# Patient Record
Sex: Female | Born: 1980 | Race: White | Hispanic: Yes | Marital: Married | Smoking: Former smoker
Health system: Southern US, Community
[De-identification: ages and names within clinical notes are randomized; demographics above are authoritative.]

## PROBLEM LIST (undated history)

## (undated) ENCOUNTER — Inpatient Hospital Stay (HOSPITAL_COMMUNITY): Payer: Self-pay

## (undated) DIAGNOSIS — K8020 Calculus of gallbladder without cholecystitis without obstruction: Secondary | ICD-10-CM

## (undated) DIAGNOSIS — F410 Panic disorder [episodic paroxysmal anxiety] without agoraphobia: Secondary | ICD-10-CM

## (undated) DIAGNOSIS — R7989 Other specified abnormal findings of blood chemistry: Secondary | ICD-10-CM

## (undated) DIAGNOSIS — K5090 Crohn's disease, unspecified, without complications: Secondary | ICD-10-CM

## (undated) HISTORY — DX: Panic disorder (episodic paroxysmal anxiety): F41.0

## (undated) HISTORY — DX: Crohn's disease, unspecified, without complications: K50.90

## (undated) HISTORY — DX: Other specified abnormal findings of blood chemistry: R79.89

## (undated) HISTORY — PX: COSMETIC SURGERY: SHX468

---

## 2015-09-11 ENCOUNTER — Encounter (HOSPITAL_BASED_OUTPATIENT_CLINIC_OR_DEPARTMENT_OTHER): Payer: Self-pay | Admitting: Emergency Medicine

## 2015-09-11 ENCOUNTER — Emergency Department (HOSPITAL_BASED_OUTPATIENT_CLINIC_OR_DEPARTMENT_OTHER): Payer: BLUE CROSS/BLUE SHIELD

## 2015-09-11 ENCOUNTER — Emergency Department (HOSPITAL_BASED_OUTPATIENT_CLINIC_OR_DEPARTMENT_OTHER)
Admission: EM | Admit: 2015-09-11 | Discharge: 2015-09-11 | Disposition: A | Discharge status: Home / Self Care | Payer: BLUE CROSS/BLUE SHIELD | Attending: Emergency Medicine | Admitting: Emergency Medicine

## 2015-09-11 DIAGNOSIS — Z793 Long term (current) use of hormonal contraceptives: Secondary | ICD-10-CM | POA: Diagnosis not present

## 2015-09-11 DIAGNOSIS — S40011A Contusion of right shoulder, initial encounter: Secondary | ICD-10-CM | POA: Diagnosis not present

## 2015-09-11 DIAGNOSIS — Z880 Allergy status to penicillin: Secondary | ICD-10-CM | POA: Diagnosis not present

## 2015-09-11 DIAGNOSIS — Y9289 Other specified places as the place of occurrence of the external cause: Secondary | ICD-10-CM | POA: Diagnosis not present

## 2015-09-11 DIAGNOSIS — Y998 Other external cause status: Secondary | ICD-10-CM | POA: Diagnosis not present

## 2015-09-11 DIAGNOSIS — Y9301 Activity, walking, marching and hiking: Secondary | ICD-10-CM | POA: Insufficient documentation

## 2015-09-11 DIAGNOSIS — F17200 Nicotine dependence, unspecified, uncomplicated: Secondary | ICD-10-CM | POA: Diagnosis not present

## 2015-09-11 DIAGNOSIS — W01198A Fall on same level from slipping, tripping and stumbling with subsequent striking against other object, initial encounter: Secondary | ICD-10-CM | POA: Diagnosis not present

## 2015-09-11 DIAGNOSIS — S4991XA Unspecified injury of right shoulder and upper arm, initial encounter: Secondary | ICD-10-CM | POA: Diagnosis present

## 2015-09-11 NOTE — ED Provider Notes (Signed)
CSN: [id number]     Arrival date & time 09/11/15  3382 History   First MD Initiated Contact with Patient 09/11/15 [phone]     Chief Complaint  Patient presents with  . Shoulder Injury    right      The history is provided by the patient. No language interpreter was used.   Marisa Sharp is a 34 y.o. female who presents to the Emergency Department complaining of right shoulder injury. Yesterday she was walking in her garage and tripped over the concrete slab, sliding forward. She had both arms outstretched. She had mild pain in her right shoulder when the fall occurred.  She applied ice and went to bed. This morning when she woke up she had too much pain to move the right shoulder. She is right-handed. No prior similar injuries. Symptoms are moderate, constant, worsening.  History reviewed. No pertinent past medical history. History reviewed. No pertinent past surgical history. No family history on file. Social History  Substance Use Topics  . Smoking status: Current Every Day Smoker  . Smokeless tobacco: None  . Alcohol Use: 0.6 oz/week    1 Glasses of wine per week   OB History    No data available     Review of Systems  All other systems reviewed and are negative.     Allergies  Penicillins  Home Medications   Prior to Admission medications   Medication Sig Start Date End Date Taking? Authorizing Provider  norethindrone-ethinyl estradiol (CYCLAFEM,ALYACEN) 0.5/0.75/1-35 MG-MCG tablet Take 1 tablet by mouth daily.   Yes Historical Provider, MD   BP 130/87 mmHg  Pulse 99  Temp(Src) 97.7 F (36.5 C) (Oral)  Resp 18  Ht 5' 4"  (1.626 m)  Wt 149 lb 14.6 oz (68 kg)  BMI 25.72 kg/m2  SpO2 100%  LMP 07/31/2015 (Approximate) Physical Exam  Constitutional: She is oriented to person, place, and time. She appears well-developed and well-nourished.  HENT:  Head: Normocephalic and atraumatic.  Cardiovascular: Normal rate and regular rhythm.   No murmur  heard. Pulmonary/Chest: Effort normal and breath sounds normal. No respiratory distress. She exhibits no tenderness.  Musculoskeletal:  2+ radial pulses. No clavicle tenderness to palpation. There is tenderness over the right upper arm and anterior and lateral shoulder. Patient is able to cross midline with the right arm. Full range of motion is intact and he elbow, wrist, digits. Unable to fully abduct right shoulder.  Neurological: She is alert and oriented to person, place, and time.  Skin: Skin is warm and dry.  Psychiatric: She has a normal mood and affect. Her behavior is normal.  Nursing note and vitals reviewed.   ED Course  Procedures (including critical care time) Labs Review Labs Reviewed - No data to display  Imaging Review No results found. I have personally reviewed and evaluated these images and lab results as part of my medical decision-making.   EKG Interpretation None      MDM   Final diagnoses:  Shoulder contusion, right, initial encounter    Patient here for evaluation of shoulder pain following an injury yesterday. She has local tenderness on examination with good perfusion. No evidence of acute fracture or dislocation. Discussed with patient home care for shoulder contusion with range of motion exercises. Outpatient follow-up discussed.    Quintella Reichert, MD 09/11/15 [phone]

## 2015-09-11 NOTE — ED Notes (Signed)
Patient states she was in the garage and fell.  She states she hit the concrete with her right shoulder.

## 2015-09-11 NOTE — ED Notes (Signed)
Discharge instructions given to patient, verbal understanding related.

## 2015-09-11 NOTE — Discharge Instructions (Signed)
Shoulder Sprain A shoulder sprain is a partial or complete tear in one of the tough, fiber-like tissues (ligaments) in the shoulder. The ligaments in the shoulder help to hold the shoulder in place. CAUSES This condition may be caused by:  A fall.  A hit to the shoulder.  A twist of the arm. RISK FACTORS This condition is more likely to develop in:  People who play sports.  People who have problems with balance or coordination. SYMPTOMS Symptoms of this condition include:  Pain when moving the shoulder.  Limited ability to move the shoulder.  Swelling and tenderness on top of the shoulder.  Warmth in the shoulder.  A change in the shape of the shoulder.  Redness or bruising on the shoulder. DIAGNOSIS This condition is diagnosed with a physical exam. During the exam, you may be asked to do simple exercises with your shoulder. You may also have imaging tests, such as X-rays, MRI, or a CT scan. These tests can show how severe the sprain is. TREATMENT This condition may be treated with:  Rest.  Pain medicine.  Ice.  A sling or brace. This is used to keep the arm still while the shoulder is healing.  Physical therapy or rehabilitation exercises. These help to improve the range of motion and strength of the shoulder.  Surgery (rare). Surgery may be needed if the sprain caused a joint to become unstable. Surgery may also be needed to reduce pain. Some people may develop ongoing shoulder pain or lose some range of motion in the shoulder. However, most people do not develop long-term problems. HOME CARE INSTRUCTIONS  Rest.  Take over-the-counter and prescription medicines only as told by your health care provider.  If directed, apply ice to the area:  Put ice in a plastic bag.  Place a towel between your skin and the bag.  Leave the ice on for 20 minutes, 2-3 times per day.  If you were given a shoulder sling or brace:  Wear it as told.  Remove it to shower or  bathe.  Move your arm only as much as told by your health care provider, but keep your hand moving to prevent swelling.  If you were shown how to do any exercises, do them as told by your health care provider.  Keep all follow-up visits as told by your health care provider. This is important. SEEK MEDICAL CARE IF:  Your pain gets worse.  Your pain is not relieved with medicines.  You have increased redness or swelling. SEEK IMMEDIATE MEDICAL CARE IF:  You have a fever.  You cannot move your arm or shoulder.  You develop numbness or tingling in your arms, hands, or fingers.   This information is not intended to replace advice given to you by your health care provider. Make sure you discuss any questions you have with your health care provider.   Document Released: 02/05/2009 Document Revised: 06/10/2015 Document Reviewed: 01/12/2015 Elsevier Interactive Patient Education [employer].

## 2015-09-15 ENCOUNTER — Ambulatory Visit: Payer: BLUE CROSS/BLUE SHIELD | Admitting: Family Medicine

## 2015-09-16 ENCOUNTER — Ambulatory Visit (INDEPENDENT_AMBULATORY_CARE_PROVIDER_SITE_OTHER): Payer: BLUE CROSS/BLUE SHIELD | Admitting: Family Medicine

## 2015-09-16 DIAGNOSIS — S4991XA Unspecified injury of right shoulder and upper arm, initial encounter: Secondary | ICD-10-CM

## 2015-09-16 MED ORDER — HYDROCODONE-ACETAMINOPHEN 5-325 MG PO TABS: 1.0000 | ORAL_TABLET | Freq: Four times a day (QID) | ORAL | Status: DC | PRN

## 2015-09-16 MED ORDER — DICLOFENAC SODIUM 75 MG PO TBEC: 75.0000 mg | DELAYED_RELEASE_TABLET | Freq: Two times a day (BID) | ORAL | Status: DC

## 2015-09-16 NOTE — Patient Instructions (Addendum)
You have strained your rotator cuff. Try to avoid painful activities (overhead activities, lifting with extended arm) as much as possible. Voltaren twice a day with food for pain and inflammation. Norco as needed for severe pain (no driving on this medicine). Consider physical therapy with transition to home exercise program. Do home exercises (arm circles, arm swings, table slides) 3 sets of 10 twice a day. When you're able to do home exercise program with theraband and scapular stabilization exercises daily - these are very important for long term relief.  I suspect it will be difficult for you to do these the next 2 weeks though. If not improving at follow-up we will consider MRI, physical therapy. Follow up with me in 2 weeks for reevaluation.

## 2015-09-18 DIAGNOSIS — S4991XA Unspecified injury of right shoulder and upper arm, initial encounter: Secondary | ICD-10-CM | POA: Insufficient documentation

## 2015-09-18 NOTE — Progress Notes (Signed)
PCP: No PCP Per Patient  Subjective:   HPI: Patient is a 34 y.o. female here for right shoulder injury.  Patient reports on 12/8 she tripped over a concrete block in the garage and fell forward (to right side as well) with arms extended in front of her. Couldn't move right shoulder mainly after this. Pain level 5/10 with movement. Worse with reaching and overhead motions. Has been icing. Pain is sharp. No skin changes, fever, other complaints. No prior history of shoulder issues.  No past medical history on file.  Current Outpatient Prescriptions on File Prior to Visit  Medication Sig Dispense Refill  . norethindrone-ethinyl estradiol (CYCLAFEM,ALYACEN) 0.5/0.75/1-35 MG-MCG tablet Take 1 tablet by mouth daily.     No current facility-administered medications on file prior to visit.    No past surgical history on file.  Allergies  Allergen Reactions  . Penicillins     Social History   Social History  . Marital Status: Married    Spouse Name: N/A  . Number of Children: N/A  . Years of Education: N/A   Occupational History  . Not on file.   Social History Main Topics  . Smoking status: Current Every Day Smoker  . Smokeless tobacco: Not on file  . Alcohol Use: 0.6 oz/week    1 Glasses of wine per week  . Drug Use: No  . Sexual Activity: Not on file   Other Topics Concern  . Not on file   Social History Narrative  . No narrative on file    No family history on file.  LMP 08/28/2015  Review of Systems: See HPI above.    Objective:  Physical Exam:  Gen: NAD  Right shoulder: No swelling, ecchymoses.  No gross deformity. No TTP. ROM - full ER and IR.  Abduction to 100 degrees, painful.  Flexion 90 degrees. Positive Hawkins, Neers. Negative Speeds, Yergasons. Strength 5/5 with empty can and resisted internal/external rotation.  Pain empty can. Negative apprehension. NV intact distally.  Left shoulder: FROM without pain.  MSK u/s Right shoulder  - Biceps tendon intact on long and trans views.  AC joint normal without arthropathy or geyser sign.  Subscapularis normal without impingement.  Infraspinatus and supraspinatus normal without tears.  No subacromial bursitis.  Assessment & Plan:  1. Right shoulder injury - Ultrasound reassuring.  Consistent with rotator cuff strain, specifically supraspinatus.  Start voltaren with norco as needed.  Shown home exercises to do daily.  Can start theraband strengthening when tolerated.  F/u in 2 weeks.  Hopefully can add physical therapy at that time.

## 2015-09-18 NOTE — Assessment & Plan Note (Signed)
Ultrasound reassuring.  Consistent with rotator cuff strain, specifically supraspinatus.  Start voltaren with norco as needed.  Shown home exercises to do daily.  Can start theraband strengthening when tolerated.  F/u in 2 weeks.  Hopefully can add physical therapy at that time.

## 2015-10-04 DIAGNOSIS — R7989 Other specified abnormal findings of blood chemistry: Secondary | ICD-10-CM

## 2015-10-04 HISTORY — DX: Other specified abnormal findings of blood chemistry: R79.89

## 2016-01-02 DIAGNOSIS — K5090 Crohn's disease, unspecified, without complications: Secondary | ICD-10-CM

## 2016-01-02 HISTORY — DX: Crohn's disease, unspecified, without complications: K50.90

## 2016-05-09 ENCOUNTER — Ambulatory Visit (INDEPENDENT_AMBULATORY_CARE_PROVIDER_SITE_OTHER): Payer: BLUE CROSS/BLUE SHIELD | Admitting: Obstetrics and Gynecology

## 2016-05-09 ENCOUNTER — Encounter: Payer: Self-pay | Admitting: Obstetrics and Gynecology

## 2016-05-09 VITALS — BP 110/64 | HR 88 | Resp 16 | Ht 64.25 in | Wt 163.8 lb

## 2016-05-09 DIAGNOSIS — Z01419 Encounter for gynecological examination (general) (routine) without abnormal findings: Secondary | ICD-10-CM | POA: Diagnosis not present

## 2016-05-09 DIAGNOSIS — E559 Vitamin D deficiency, unspecified: Secondary | ICD-10-CM

## 2016-05-09 DIAGNOSIS — Z0000 Encounter for general adult medical examination without abnormal findings: Secondary | ICD-10-CM

## 2016-05-09 LAB — POCT URINALYSIS DIPSTICK
Bilirubin, UA: NEGATIVE
Glucose, UA: NEGATIVE
Ketones, UA: NEGATIVE
LEUKOCYTES UA: NEGATIVE
NITRITE UA: NEGATIVE
PROTEIN UA: NEGATIVE
UROBILINOGEN UA: NEGATIVE
pH, UA: 5

## 2016-05-09 NOTE — Patient Instructions (Signed)
Health Maintenance, Female Adopting a healthy lifestyle and getting preventive care can go a long way to promote health and wellness. Talk with your health care provider about what schedule of regular examinations is right for you. This is a good chance for you to check in with your provider about disease prevention and staying healthy. In between checkups, there are plenty of things you can do on your own. Experts have done a lot of research about which lifestyle changes and preventive measures are most likely to keep you healthy. Ask your health care provider for more information. WEIGHT AND DIET  Eat a healthy diet  Be sure to include plenty of vegetables, fruits, low-fat dairy products, and lean protein.  Do not eat a lot of foods high in solid fats, added sugars, or salt.  Get regular exercise. This is one of the most important things you can do for your health.  Most adults should exercise for at least 150 minutes each week. The exercise should increase your heart rate and make you sweat (moderate-intensity exercise).  Most adults should also do strengthening exercises at least twice a week. This is in addition to the moderate-intensity exercise.  Maintain a healthy weight  Body mass index (BMI) is a measurement that can be used to identify possible weight problems. It estimates body fat based on height and weight. Your health care provider can help determine your BMI and help you achieve or maintain a healthy weight.  For females 20 years of age and older:   A BMI below 18.5 is considered underweight.  A BMI of 18.5 to 24.9 is normal.  A BMI of 25 to 29.9 is considered overweight.  A BMI of 30 and above is considered obese.  Watch levels of cholesterol and blood lipids  You should start having your blood tested for lipids and cholesterol at 35 years of age, then have this test every 5 years.  You may need to have your cholesterol levels checked more often if:  Your lipid  or cholesterol levels are high.  You are older than 35 years of age.  You are at high risk for heart disease.  CANCER SCREENING   Lung Cancer  Lung cancer screening is recommended for adults 55-80 years old who are at high risk for lung cancer because of a history of smoking.  A yearly low-dose CT scan of the lungs is recommended for people who:  Currently smoke.  Have quit within the past 15 years.  Have at least a 30-pack-year history of smoking. A pack year is smoking an average of one pack of cigarettes a day for 1 year.  Yearly screening should continue until it has been 15 years since you quit.  Yearly screening should stop if you develop a health problem that would prevent you from having lung cancer treatment.  Breast Cancer  Practice breast self-awareness. This means understanding how your breasts normally appear and feel.  It also means doing regular breast self-exams. Let your health care provider know about any changes, no matter how small.  If you are in your 20s or 30s, you should have a clinical breast exam (CBE) by a health care provider every 1-3 years as part of a regular health exam.  If you are 40 or older, have a CBE every year. Also consider having a breast X-ray (mammogram) every year.  If you have a family history of breast cancer, talk to your health care provider about genetic screening.  If you   are at high risk for breast cancer, talk to your health care provider about having an MRI and a mammogram every year.  Breast cancer gene (BRCA) assessment is recommended for women who have family members with BRCA-related cancers. BRCA-related cancers include:  Breast.  Ovarian.  Tubal.  Peritoneal cancers.  Results of the assessment will determine the need for genetic counseling and BRCA1 and BRCA2 testing. Cervical Cancer Your health care provider may recommend that you be screened regularly for cancer of the pelvic organs (ovaries, uterus, and  vagina). This screening involves a pelvic examination, including checking for microscopic changes to the surface of your cervix (Pap test). You may be encouraged to have this screening done every 3 years, beginning at age 21.  For women ages 30-65, health care providers may recommend pelvic exams and Pap testing every 3 years, or they may recommend the Pap and pelvic exam, combined with testing for human papilloma virus (HPV), every 5 years. Some types of HPV increase your risk of cervical cancer. Testing for HPV may also be done on women of any age with unclear Pap test results.  Other health care providers may not recommend any screening for nonpregnant women who are considered low risk for pelvic cancer and who do not have symptoms. Ask your health care provider if a screening pelvic exam is right for you.  If you have had past treatment for cervical cancer or a condition that could lead to cancer, you need Pap tests and screening for cancer for at least 20 years after your treatment. If Pap tests have been discontinued, your risk factors (such as having a new sexual partner) need to be reassessed to determine if screening should resume. Some women have medical problems that increase the chance of getting cervical cancer. In these cases, your health care provider may recommend more frequent screening and Pap tests. Colorectal Cancer  This type of cancer can be detected and often prevented.  Routine colorectal cancer screening usually begins at 35 years of age and continues through 35 years of age.  Your health care provider may recommend screening at an earlier age if you have risk factors for colon cancer.  Your health care provider may also recommend using home test kits to check for hidden blood in the stool.  A small camera at the end of a tube can be used to examine your colon directly (sigmoidoscopy or colonoscopy). This is done to check for the earliest forms of colorectal  cancer.  Routine screening usually begins at age 50.  Direct examination of the colon should be repeated every 5-10 years through 35 years of age. However, you may need to be screened more often if early forms of precancerous polyps or small growths are found. Skin Cancer  Check your skin from head to toe regularly.  Tell your health care provider about any new moles or changes in moles, especially if there is a change in a mole's shape or color.  Also tell your health care provider if you have a mole that is larger than the size of a pencil eraser.  Always use sunscreen. Apply sunscreen liberally and repeatedly throughout the day.  Protect yourself by wearing long sleeves, pants, a wide-brimmed hat, and sunglasses whenever you are outside. HEART DISEASE, DIABETES, AND HIGH BLOOD PRESSURE   High blood pressure causes heart disease and increases the risk of stroke. High blood pressure is more likely to develop in:  People who have blood pressure in the high end   of the normal range (130-139/85-89 mm Hg).  People who are overweight or obese.  People who are African American.  If you are 38-23 years of age, have your blood pressure checked every 3-5 years. If you are 61 years of age or older, have your blood pressure checked every year. You should have your blood pressure measured twice--once when you are at a hospital or clinic, and once when you are not at a hospital or clinic. Record the average of the two measurements. To check your blood pressure when you are not at a hospital or clinic, you can use:  An automated blood pressure machine at a pharmacy.  A home blood pressure monitor.  If you are between 45 years and 39 years old, ask your health care provider if you should take aspirin to prevent strokes.  Have regular diabetes screenings. This involves taking a blood sample to check your fasting blood sugar level.  If you are at a normal weight and have a low risk for diabetes,  have this test once every three years after 35 years of age.  If you are overweight and have a high risk for diabetes, consider being tested at a younger age or more often. PREVENTING INFECTION  Hepatitis B  If you have a higher risk for hepatitis B, you should be screened for this virus. You are considered at high risk for hepatitis B if:  You were born in a country where hepatitis B is common. Ask your health care provider which countries are considered high risk.  Your parents were born in a high-risk country, and you have not been immunized against hepatitis B (hepatitis B vaccine).  You have HIV or AIDS.  You use needles to inject street drugs.  You live with someone who has hepatitis B.  You have had sex with someone who has hepatitis B.  You get hemodialysis treatment.  You take certain medicines for conditions, including cancer, organ transplantation, and autoimmune conditions. Hepatitis C  Blood testing is recommended for:  Everyone born from [birth year] through 1965.  Anyone with known risk factors for hepatitis C. Sexually transmitted infections (STIs)  You should be screened for sexually transmitted infections (STIs) including gonorrhea and chlamydia if:  You are sexually active and are younger than 35 years of age.  You are older than 35 years of age and your health care provider tells you that you are at risk for this type of infection.  Your sexual activity has changed since you were last screened and you are at an increased risk for chlamydia or gonorrhea. Ask your health care provider if you are at risk.  If you do not have HIV, but are at risk, it may be recommended that you take a prescription medicine daily to prevent HIV infection. This is called pre-exposure prophylaxis (PrEP). You are considered at risk if:  You are sexually active and do not regularly use condoms or know the HIV status of your partner(s).  You take drugs by injection.  You are sexually  active with a partner who has HIV. Talk with your health care provider about whether you are at high risk of being infected with HIV. If you choose to begin PrEP, you should first be tested for HIV. You should then be tested every 3 months for as long as you are taking PrEP.  PREGNANCY   If you are premenopausal and you may become pregnant, ask your health care provider about preconception counseling.  If you may  become pregnant, take 400 to 800 micrograms (mcg) of folic acid every day.  If you want to prevent pregnancy, talk to your health care provider about birth control (contraception). OSTEOPOROSIS AND MENOPAUSE   Osteoporosis is a disease in which the bones lose minerals and strength with aging. This can result in serious bone fractures. Your risk for osteoporosis can be identified using a bone density scan.  If you are 38 years of age or older, or if you are at risk for osteoporosis and fractures, ask your health care provider if you should be screened.  Ask your health care provider whether you should take a calcium or vitamin D supplement to lower your risk for osteoporosis.  Menopause may have certain physical symptoms and risks.  Hormone replacement therapy may reduce some of these symptoms and risks. Talk to your health care provider about whether hormone replacement therapy is right for you.  HOME CARE INSTRUCTIONS   Schedule regular health, dental, and eye exams.  Stay current with your immunizations.   Do not use any tobacco products including cigarettes, chewing tobacco, or electronic cigarettes.  If you are pregnant, do not drink alcohol.  If you are breastfeeding, limit how much and how often you drink alcohol.  Limit alcohol intake to no more than 1 drink per day for nonpregnant women. One drink equals 12 ounces of beer, 5 ounces of wine, or 1 ounces of hard liquor.  Do not use street drugs.  Do not share needles.  Ask your health care provider for help if  you need support or information about quitting drugs.  Tell your health care provider if you often feel depressed.  Tell your health care provider if you have ever been abused or do not feel safe at home.   This information is not intended to replace advice given to you by your health care provider. Make sure you discuss any questions you have with your health care provider.   Document Released: 04/04/2011 Document Revised: 10/10/2014 Document Reviewed: 08/21/2013 Elsevier Interactive Patient Education [employer].

## 2016-05-09 NOTE — Progress Notes (Signed)
35 y.o. G80P1001 Married Caucasian [place] female here for annual exam.    From [place], [place].  She and husband work for [employer]. Works as a Banker. Daughter is 37.12 years old.   Stopped smoking one month ago.  Has birth control pills from [place].  Considering long acting contraception.  Worried about blood clots.   Dx of Crohn's disease one month ago.  Did a lot of blood work for this dx.  Did cholesterol and glucose at work.   Wants vit D checked.  Hx low level.   PCP:   None  Patient's last menstrual period was 05/06/2016 (exact date).           Sexually active: Yes.   female The current method of family planning is OCP (estrogen/progesterone)--Gynera 0/68m.   Gestodeno 0.075 mg/ethinyl estradiol 0.03 mg. Exercising: Yes.    runs 3days/week. Smoker:  No, quit last month  Health Maintenance: Pap:  2015 normal per patient.   History of abnormal Pap:  no MMG:  n/a Colonoscopy:  01/2016 diagnosed with Crohns disease BMD:   n/a  Result  n/a TDaP:  Up to date per patient Gardasil:   no Screening Labs:  Urine today: trace of blood in urine.  On menses.   reports that she quit smoking about 4 weeks ago. Her smoking use included Cigarettes. She has never used smokeless tobacco. She reports that she drinks about 1.2 oz of alcohol per week . She reports that she does not use drugs.  Past Medical History:  Diagnosis Date  . Crohn's disease ([place] 01/2016    Past Surgical History:  Procedure Laterality Date  . COSMETIC SURGERY     bilateral ear surgery    Current Outpatient Prescriptions  Medication Sig Dispense Refill  . InFLIXimab (REMICADE IV) Inject into the vein.    .Marland KitchenPRESCRIPTION MEDICATION Gynera OCP([place] name) 0.050m    No current facility-administered medications for this visit.     Family History  Problem Relation Age of Onset  . Cancer Father 6022  prostate ca  . Diabetes Father   . Hypertension Father   . Seizures Father     Parkinsons  .  Stroke Father   . Thyroid disease Sister   . Diabetes Paternal Grandmother     ROS:  Pertinent items are noted in HPI.  Otherwise, a comprehensive ROS was negative.  Exam:   BP 110/64 (BP Location: Right Arm, Patient Position: Sitting, Cuff Size: Normal)   Pulse 88   Resp 16   Ht 5' 4.25" (1.632 m)   Wt 163 lb 12.8 oz (74.3 kg)   LMP 05/06/2016 (Exact Date)   BMI 27.90 kg/m     General appearance: alert, cooperative and appears stated age Head: Normocephalic, without obvious abnormality, atraumatic Neck: no adenopathy, supple, symmetrical, trachea midline and thyroid normal to inspection and palpation Lungs: clear to auscultation bilaterally Breasts: normal appearance, no masses or tenderness, No nipple retraction or dimpling, No nipple discharge or bleeding, No axillary or supraclavicular adenopathy Heart: regular rate and rhythm Abdomen: soft, non-tender; no masses, no organomegaly Extremities: extremities normal, atraumatic, no cyanosis or edema Skin: Skin color, texture, turgor normal. No rashes or lesions Lymph nodes: Cervical, supraclavicular, and axillary nodes normal. No abnormal inguinal nodes palpated Neurologic: Grossly normal  Pelvic: External genitalia:  no lesions              Urethra:  normal appearing urethra with no masses, tenderness or lesions  Bartholins and Skenes: normal                 Vagina: normal appearing vagina with normal color and discharge, no lesions              Cervix: no lesions              Pap taken: Yes.   Bimanual Exam:  Uterus:  normal size, contour, position, consistency, mobility, non-tender              Adnexa: no mass, fullness, tenderness          Chaperone was present for exam.  Assessment:   Well woman visit with normal exam. Recent smoker.  Age 30 yo.   Plan: Yearly mammogram recommended after age 95.  Recommended self breast exam.  Pap and HR HPV as above. Discussed Calcium, Vitamin D, regular exercise  program including cardiovascular and weight bearing exercise. Information about Nexplanon, Mirena, and ParaGard IUD.  Discussed risk of MI and stroke if smoking age 44 and above and taking combined oral contraceptives.  Check vit D.  Follow up annually and prn.       After visit summary provided.

## 2016-05-10 LAB — VITAMIN D 25 HYDROXY (VIT D DEFICIENCY, FRACTURES): VIT D 25 HYDROXY: 18 ng/mL — AB (ref 30–100)

## 2016-05-10 MED ORDER — VITAMIN D (ERGOCALCIFEROL) 1.25 MG (50000 UNIT) PO CAPS: 50000.0000 [IU] | ORAL_CAPSULE | ORAL | 0 refills | Status: DC

## 2016-05-10 NOTE — Addendum Note (Signed)
Addended by: Remigio Eisenmenger on: 05/10/2016 03:21 PM   Modules accepted: Orders

## 2016-05-11 LAB — IPS PAP TEST WITH HPV

## 2016-06-23 ENCOUNTER — Telehealth: Payer: Self-pay | Admitting: Obstetrics and Gynecology

## 2016-06-23 NOTE — Telephone Encounter (Signed)
I just did some research, and the ParaGard IUD looks like the best option.  The Remicade can actually decreased the efficacy of hormonal contraception - pills and the Mirena IUD included! The Remicade can increase risk of infection.  If the patient were exposed to a sexually transmitted disease with the IUD in place, theoretically, the infection could be worse due to the Remicade.  I do not recommend she continue on the oral contraceptives if she is still smoking at all.  We discussed risk of stroke if she is still smoking at age 35.  Please make an office visit if she would like to discuss the ParaGard IUD further.

## 2016-06-23 NOTE — Telephone Encounter (Signed)
Patient is asking to talk with Dr.Silva to "clarify some doubts" that she is having. No further details given.

## 2016-06-23 NOTE — Telephone Encounter (Signed)
Spoke with patient. Patient states she started Remicade IV treatment 1 month ago for treatment of Crohn's Disease. Patient currently on Gynera OCP. Patient states ot last AEX 05/09/16, other options of contraception were reviewed. Patient states she has done a lot of research and states the Remicade weakens the immune system. Patient states she feels her immune system is low right now due to the treatment and is at risk for infection. Patient is asking should she stay on current OCP, IUD or Nexplanon? She is asking which is the best option given the situation? Offered patient to come in to office to discuss the options, patient decline at this time. Patient would like to know what Dr. Quincy Simmonds recommends. Advised I would review with Dr. Quincy Simmonds and call back with recommendations. Patient is agreeable.

## 2016-06-24 NOTE — Telephone Encounter (Signed)
Returned call to patient, Unable to leave message.

## 2016-06-29 NOTE — Telephone Encounter (Signed)
Spoke with patient. Advised as seen below per Dr. Quincy Simmonds. Patient is agreeable and verbalizes understanding. Patient scheduled for 06/30/16 at 11:30am with Dr. Quincy Simmonds.   Routing to provider for final review. Patient is agreeable to disposition. Will close encounter.

## 2016-06-29 NOTE — Telephone Encounter (Signed)
Patient is returning your call from Friday

## 2016-06-30 ENCOUNTER — Encounter: Payer: Self-pay | Admitting: Obstetrics and Gynecology

## 2016-06-30 ENCOUNTER — Ambulatory Visit (INDEPENDENT_AMBULATORY_CARE_PROVIDER_SITE_OTHER): Payer: BLUE CROSS/BLUE SHIELD | Admitting: Obstetrics and Gynecology

## 2016-06-30 VITALS — BP 102/72 | HR 76 | Ht 64.25 in | Wt 169.0 lb

## 2016-06-30 DIAGNOSIS — Z3009 Encounter for other general counseling and advice on contraception: Secondary | ICD-10-CM | POA: Diagnosis not present

## 2016-06-30 NOTE — Progress Notes (Signed)
GYNECOLOGY  VISIT   HPI: 35 y.o.   Married  Caucasian/[place]ian female   [place] with Patient's last menstrual period was 06/06/2016 (approximate).   here to discuss contraception options--interested maybe in Paragard IUD.    Birth control pills from [place] are discontinued.  Has one more pack at home.   Stopped smoking one month ago.  Having hair loss after taking cortisone.  Wondering if there is anything she can take for this.   Patient is on Remicade.   GYNECOLOGIC HISTORY: Patient's last menstrual period was 06/06/2016 (approximate). Contraception:  OCPs--from [place] Menopausal hormone therapy:  n/a Last mammogram:  n/a Last pap smear:  05-09-16 Neg:Neg HR HPV        OB History    Gravida Para Term Preterm AB Living   1 1 1     1    SAB TAB Ectopic Multiple Live Births                     Patient Active Problem List   Diagnosis Date Noted  . Right shoulder injury 09/18/2015    Past Medical History:  Diagnosis Date  . Crohn's disease ([place]) 01/2016    Past Surgical History:  Procedure Laterality Date  . COSMETIC SURGERY     bilateral ear surgery    Current Outpatient Prescriptions  Medication Sig Dispense Refill  . clonazePAM (KLONOPIN) 0.5 MG tablet Take 1 tablet by mouth as needed.    . inFLIXimab (REMICADE) 100 MG injection Inject into the vein.    Marland Kitchen PARoxetine (PAXIL) 20 MG tablet Take 1 tablet by mouth daily.    Marland Kitchen PRESCRIPTION MEDICATION Gynera OCP([place] name) 0.32m    . Vitamin D, Ergocalciferol, (DRISDOL) 50000 units CAPS capsule Take 1 capsule (50,000 Units total) by mouth 2 (two) times a week. 24 capsule 0   No current facility-administered medications for this visit.      ALLERGIES: Penicillins  Family History  Problem Relation Age of Onset  . Cancer Father 630   prostate ca  . Diabetes Father   . Hypertension Father   . Seizures Father     Parkinsons  . Stroke Father   . Thyroid disease Sister   . Diabetes Paternal Grandmother      Social History   Social History  . Marital status: Married    Spouse name: N/A  . Number of children: N/A  . Years of education: N/A   Occupational History  . Not on file.   Social History Main Topics  . Smoking status: Former Smoker    Types: Cigarettes    Quit date: 04/08/2016  . Smokeless tobacco: Never Used  . Alcohol use 1.2 oz/week    2 Glasses of wine per week     Comment: drinks on weekends  . Drug use: No  . Sexual activity: Yes    Birth control/ protection: Pill     Comment: Gynera 0.053m  Other Topics Concern  . Not on file   Social History Narrative  . No narrative on file    ROS:  Pertinent items are noted in HPI.  PHYSICAL EXAMINATION:    BP 102/72 (BP Location: Right Arm, Patient Position: Sitting, Cuff Size: Normal)   Pulse 76   Ht 5' 4.25" (1.632 m)   Wt 169 lb (76.7 kg)   LMP 06/06/2016 (Approximate)   BMI 28.78 kg/m     General appearance: alert, cooperative and appears stated age   ASSESSMENT  Combined oral contraceptive  use.  Recent smoker.  Approaching age 27.  Remicade use. Hair loss.   PLAN  We discussed smoking and combined contraceptive use and how it is contraindicated for patients 35 and over who are smokers due to risk of MI and stroke.  She needs to not smoke for 3 months to be able to use combined contraception. That being said, use use of hormonal contraception and Remicade results in decreased efficacy of the contraception.  For patient to continue on hormonal contraception, I would recommend using back up protection with condoms and spermicide or diaphragm and spermicide.  If she continues on oral contraception, we discussed the Loestrin family of OCPs. We discussed ParaGard IUD in detail in verbal and written form.  We reviewed risks and benefits.  Patient will consider this.  We discussed hair cycle growth and the need for time for her hair loss to recover.  Follow up prn.   An After Visit Summary was printed and  given to the patient.  ___25___ minutes face to face time of which over 50% was spent in counseling.

## 2016-07-21 ENCOUNTER — Telehealth: Payer: Self-pay | Admitting: Obstetrics and Gynecology

## 2016-07-21 NOTE — Telephone Encounter (Signed)
Patient would like to discuss getting the paragard.

## 2016-07-21 NOTE — Telephone Encounter (Signed)
Attempted to reach patient at number provided [phone], there was no answer and recording states "The wireless customer you are trying to reach is not available at this time. Please try again later."

## 2016-07-25 NOTE — Telephone Encounter (Signed)
Spoke with patient. Patient states that she would like to move forward with having a Paragard IUD placed as discussed with Dr.Silva on 06/30/2016. Reports she took the last pill in her Alyacen pack last night. Does not plan to restart OCP. Has not started menses. Advised she will need to contact the office with the first day of her next menstrual cycle to schedule her IUD insertion. Patient is agreeable and verbalizes understanding.  Routing to provider for final review. Patient agreeable to disposition. Will close encounter.

## 2016-07-29 ENCOUNTER — Telehealth: Payer: Self-pay | Admitting: Obstetrics and Gynecology

## 2016-07-29 DIAGNOSIS — Z3009 Encounter for other general counseling and advice on contraception: Secondary | ICD-10-CM

## 2016-07-29 MED ORDER — MISOPROSTOL 200 MCG PO TABS: ORAL_TABLET | ORAL | 0 refills | Status: DC

## 2016-07-29 NOTE — Telephone Encounter (Signed)
Spoke with patient. Patient ready to schedule paragard IUD insertion. LMP 07/29/16. Reviewed appointment times, placed patient on brief hold. Reviewed provider schedule with Lamont Snowball due to no available appointments. Advised can schedule patient 08/01/16 at 11:00. Advise patient Dr. Quincy Simmonds will be coming from surgery, keep phone available, will notify if any delays expected. Spoke with patient, advised as seen above. Patient agreeable to date and time, phone number on file correct. Advised of IUD instructions:Cytotec 200 mcg  Pace 1 tablet vaginal the night before procedure. Place 1 tablet vaginal the morning of the procedure. Motrin 800 mg with food and water one hour before procedure.Eat and hydrate well. Patient verbalizes understanding and is agreeable.   Orders placed for cytotec and IUD insertion placed.  Cc: Lamont Snowball; Theresia Lo  Routing to provider for final review. Patient is agreeable to disposition. Will close encounter.

## 2016-07-29 NOTE — Telephone Encounter (Signed)
Patient wants to speak with a nurse regarding her cycle

## 2016-08-01 ENCOUNTER — Encounter: Payer: Self-pay | Admitting: Obstetrics and Gynecology

## 2016-08-01 ENCOUNTER — Ambulatory Visit (INDEPENDENT_AMBULATORY_CARE_PROVIDER_SITE_OTHER): Payer: BLUE CROSS/BLUE SHIELD | Admitting: Obstetrics and Gynecology

## 2016-08-01 VITALS — BP 118/70 | HR 90 | Ht 64.25 in | Wt 168.6 lb

## 2016-08-01 DIAGNOSIS — Z3009 Encounter for other general counseling and advice on contraception: Secondary | ICD-10-CM

## 2016-08-01 DIAGNOSIS — Z30430 Encounter for insertion of intrauterine contraceptive device: Secondary | ICD-10-CM | POA: Diagnosis not present

## 2016-08-01 DIAGNOSIS — R6882 Decreased libido: Secondary | ICD-10-CM

## 2016-08-01 LAB — POCT URINE PREGNANCY: PREG TEST UR: NEGATIVE

## 2016-08-01 NOTE — Progress Notes (Signed)
GYNECOLOGY  VISIT   HPI: 35 y.o.   Married  Caucasian/[place] female   [place] with Patient's last menstrual period was 07/29/2016 (exact date).   here for Paragard IUD insertion.   Patient is a smoker almost 35 years ago.  On Remicade for Crohn's disease.  Uses ibuprofen for pain control when needs it.  Took 800 mg ibuprofen prior to visit today.   States decreased libido.  On Paxil for panic disorder.   UPT: negative today.  GYNECOLOGIC HISTORY: Patient's last menstrual period was 07/29/2016 (exact date). Contraception: OCP--Gynera  Menopausal hormone therapy:  n/a Last mammogram:  n/a Last pap smear:   05-09-16 Neg:Neg HR HPV        OB History    Gravida Para Term Preterm AB Living   1 1 1     1    SAB TAB Ectopic Multiple Live Births                     Patient Active Problem List   Diagnosis Date Noted  . Right shoulder injury 09/18/2015    Past Medical History:  Diagnosis Date  . Crohn's disease ([place]) 01/2016  . Panic disorder     Past Surgical History:  Procedure Laterality Date  . COSMETIC SURGERY     bilateral ear surgery    Current Outpatient Prescriptions  Medication Sig Dispense Refill  . inFLIXimab (REMICADE) 100 MG injection Inject into the vein.    . misoprostol (CYTOTEC) 200 MCG tablet Place 1 tablet vaginally night before procedure. Place one tablet vaginally morning of procedure. 2 tablet 0  . PARoxetine (PAXIL) 20 MG tablet Take 1 tablet by mouth daily.    Marland Kitchen PRESCRIPTION MEDICATION Gynera OCP([place] name) 0.22m    . Vitamin D, Ergocalciferol, (DRISDOL) 50000 units CAPS capsule Take 1 capsule (50,000 Units total) by mouth 2 (two) times a week. 24 capsule 0  . clonazePAM (KLONOPIN) 0.5 MG tablet Take 1 tablet by mouth as needed.     No current facility-administered medications for this visit.      ALLERGIES: Penicillins  Family History  Problem Relation Age of Onset  . Cancer Father 683   prostate ca  . Diabetes Father   .  Hypertension Father   . Seizures Father     Parkinsons  . Stroke Father   . Thyroid disease Sister   . Diabetes Paternal Grandmother     Social History   Social History  . Marital status: Married    Spouse name: N/A  . Number of children: N/A  . Years of education: N/A   Occupational History  . Not on file.   Social History Main Topics  . Smoking status: Former Smoker    Types: Cigarettes    Quit date: 04/08/2016  . Smokeless tobacco: Never Used  . Alcohol use 1.2 oz/week    2 Glasses of wine per week     Comment: drinks on weekends  . Drug use: No  . Sexual activity: Yes    Birth control/ protection: Pill     Comment: Gynera 0.058m  Other Topics Concern  . Not on file   Social History Narrative  . No narrative on file    ROS:  Pertinent items are noted in HPI.  PHYSICAL EXAMINATION:    BP 118/70 (BP Location: Right Arm, Patient Position: Sitting, Cuff Size: Normal)   Pulse 90   Ht 5' 4.25" (1.632 m)   Wt 168 lb 9.6 oz (76.5  kg)   LMP 07/29/2016 (Exact Date)   BMI 28.72 kg/m     General appearance: alert, cooperative and appears stated age    Pelvic: External genitalia:  no lesions              Urethra:  normal appearing urethra with no masses, tenderness or lesions              Bartholins and Skenes: normal                 Vagina: normal appearing vagina with normal color and discharge, no lesions              Cervix: no lesions                Bimanual Exam:  Uterus:  normal size, contour, position, consistency, mobility, non-tender              Adnexa: no mass, fullness, tenderness   ParaGard IUD insertion - lot number [id number], expiration 07/2022.  Consent for procedure.  Speculum placed in the vagina.  Hibiclens prep to cervix.  Tenaculum to anterior cervical lip.  Uterus sounded to 7 cm.  IUD placed without difficulty.  Strings trimmed and shown to patient.  Repeat bimanual exam - no change.  No complications.  Minimal EBL.  Information card  to patient.             Chaperone was present for exam.  ASSESSMENT  ParaGard IUD insertion.   Decreased libido.   On Paxil.   PLAN  Instructions and precautions given.  Follow up in 5 weeks. I discussed effect of Paxil on libido.  She may contact her provider to determine if she can lower her Paxil dosage or change to another option. We also discussed testosterone supplementation if desired in future.      An After Visit Summary was printed and given to the patient.

## 2016-08-01 NOTE — Patient Instructions (Signed)

## 2016-08-09 ENCOUNTER — Other Ambulatory Visit: Payer: Self-pay | Admitting: Obstetrics and Gynecology

## 2016-08-09 DIAGNOSIS — E559 Vitamin D deficiency, unspecified: Secondary | ICD-10-CM

## 2016-08-10 ENCOUNTER — Telehealth: Payer: Self-pay | Admitting: Obstetrics and Gynecology

## 2016-08-10 ENCOUNTER — Other Ambulatory Visit: Payer: Self-pay

## 2016-08-10 NOTE — Telephone Encounter (Signed)
Spoke with patient, she is scheduled to come in for Vit D recheck today at 2:00p.m. Per last vit d result note 05/09/16 patient was to return in 3 months for recheck.

## 2016-08-10 NOTE — Telephone Encounter (Signed)
Attempted to call patient regarding missed lab appointment. Was not able to leave message.

## 2016-08-10 NOTE — Telephone Encounter (Signed)
Medication refill request: Vitamin D 50000 units Last AEX:  05/09/16 BS Next AEX: 09/05/16  Last MMG (if hormonal medication request): n/a Refill authorized: 05/12/16 #24 w/0 refills; today will call and schedule lab appt

## 2016-08-10 NOTE — Telephone Encounter (Signed)
Please make a note in chart to do blood work when she returns on December 4 for her recheck.  You may close the encounter.  Thanks.

## 2016-08-11 ENCOUNTER — Telehealth: Payer: Self-pay | Admitting: Obstetrics and Gynecology

## 2016-08-11 NOTE — Telephone Encounter (Signed)
Spoke with patient. Advised as seen below per Dr. Elza Rafter recommendations. Patient states no irregular bleeding or significant pain, denies need for OV at this time. Patient verbalizes understanding and is agreeable.  Routing to provider for final review. Patient is agreeable to disposition. Will close encounter.

## 2016-08-11 NOTE — Telephone Encounter (Signed)
Patient says she is having some lower back discomfort and wondering if it is from the paragard she recently had placed.

## 2016-08-11 NOTE — Telephone Encounter (Signed)
I suspect the discomfort is from coming off her birth control pills and starting to ovulate again.  She should have immediate contraception.  If she has significant pain or irregular bleeding, needs office visit.  Otherwise, keep appointment with me for 5 week IUD check.

## 2016-08-11 NOTE — Telephone Encounter (Signed)
Spoke with patient. Patient states she had Paragard IUD placed 08/01/16. Patient states she had intercourse 08/08/16 and has been experiencing lower back discomfort and "belly" discomfort. Patient states it is not pain, just discomfort. RN asked if she has tried any motrin or heat for relief, patient denied. Patient denies any vaginal bleeding/discharge. Patient would like to know if this is normal? Patient also asking how soon does contraception work after being placed? Advised patient if placed while on cycle, contraception is provided. LMP 07/29/16. Advised patient I would review with Dr. Quincy Simmonds for recommendations and return call, patient is agreeable.   Dr. Quincy Simmonds, please advise?

## 2016-09-05 ENCOUNTER — Ambulatory Visit: Payer: BLUE CROSS/BLUE SHIELD | Admitting: Obstetrics and Gynecology

## 2016-09-12 ENCOUNTER — Encounter: Payer: Self-pay | Admitting: Obstetrics and Gynecology

## 2016-09-12 ENCOUNTER — Ambulatory Visit (INDEPENDENT_AMBULATORY_CARE_PROVIDER_SITE_OTHER): Payer: BLUE CROSS/BLUE SHIELD | Admitting: Obstetrics and Gynecology

## 2016-09-12 VITALS — BP 110/72 | HR 70 | Ht 64.25 in | Wt 171.8 lb

## 2016-09-12 DIAGNOSIS — Z30431 Encounter for routine checking of intrauterine contraceptive device: Secondary | ICD-10-CM | POA: Diagnosis not present

## 2016-09-12 NOTE — Progress Notes (Signed)
GYNECOLOGY  VISIT   HPI: 35 y.o.   Married  Caucasian/[place]ian female   [place] with Patient's last menstrual period was 08/26/2016 (exact date).   here for follow up after Paragard IUD insertion.   Last menses was regular.  Little bit more heavy flow and no cramping.   No painful intercourse.   Happy with the IUD.   GYNECOLOGIC HISTORY: Patient's last menstrual period was 08/26/2016 (exact date). Contraception:  Paragard IUD inserted 08-01-16 Menopausal hormone therapy:  n/a Last mammogram:  n/a Last pap smear:  05-09-16 Neg:Neg HR HPV         OB History    Gravida Para Term Preterm AB Living   1 1 1     1    SAB TAB Ectopic Multiple Live Births                     Patient Active Problem List   Diagnosis Date Noted  . Right shoulder injury 09/18/2015    Past Medical History:  Diagnosis Date  . Crohn's disease ([place]) 01/2016  . Panic disorder     Past Surgical History:  Procedure Laterality Date  . COSMETIC SURGERY     bilateral ear surgery    Current Outpatient Prescriptions  Medication Sig Dispense Refill  . inFLIXimab (REMICADE) 100 MG injection Inject into the vein.    Marland Kitchen PARAGARD INTRAUTERINE COPPER IU by Intrauterine route.    Marland Kitchen PARoxetine (PAXIL) 20 MG tablet Take 1 tablet by mouth daily.    . Vitamin D, Ergocalciferol, (DRISDOL) 50000 units CAPS capsule Take 1 capsule (50,000 Units total) by mouth 2 (two) times a week. 24 capsule 0  . clonazePAM (KLONOPIN) 0.5 MG tablet Take 1 tablet by mouth as needed.     No current facility-administered medications for this visit.      ALLERGIES: Penicillins  Family History  Problem Relation Age of Onset  . Cancer Father 10    prostate ca  . Diabetes Father   . Hypertension Father   . Seizures Father     Parkinsons  . Stroke Father   . Thyroid disease Sister   . Diabetes Paternal Grandmother     Social History   Social History  . Marital status: Married    Spouse name: N/A  . Number of children: N/A   . Years of education: N/A   Occupational History  . Not on file.   Social History Main Topics  . Smoking status: Former Smoker    Types: Cigarettes    Quit date: 04/08/2016  . Smokeless tobacco: Never Used  . Alcohol use 1.2 oz/week    2 Glasses of wine per week     Comment: drinks on weekends  . Drug use: No  . Sexual activity: Yes    Birth control/ protection: IUD     Comment: Paragard IUD inserted 08-01-16   Other Topics Concern  . Not on file   Social History Narrative  . No narrative on file    ROS:  Pertinent items are noted in HPI.  PHYSICAL EXAMINATION:    BP 110/72 (BP Location: Right Arm, Patient Position: Sitting, Cuff Size: Normal)   Pulse 70   Ht 5' 4.25" (1.632 m)   Wt 171 lb 12.8 oz (77.9 kg)   LMP 08/26/2016 (Exact Date)   BMI 29.26 kg/m     General appearance: alert, cooperative and appears stated age    Pelvic: External genitalia:  no lesions  Urethra:  normal appearing urethra with no masses, tenderness or lesions              Bartholins and Skenes: normal                 Vagina: normal appearing vagina with normal color and discharge, no lesions              Cervix: no lesions.  IUD strings noted and trimmed to 2 cm.                 Bimanual Exam:  Uterus:  normal size, contour, position, consistency, mobility, non-tender              Adnexa: no mass, fullness, tenderness      Chaperone was present for exam.  ASSESSMENT  ParaGard IUD patient.   PLAN  IUD working well for contraception.  OK to keep in place for 10 years.  NSAIDs for dysmenorrhea or heavy cycles.  Annual exam in August 2018.     An After Visit Summary was printed and given to the patient.  __15____ minutes face to face time of which over 50% was spent in counseling.

## 2017-06-02 ENCOUNTER — Ambulatory Visit: Payer: BLUE CROSS/BLUE SHIELD | Admitting: Obstetrics and Gynecology

## 2017-07-10 ENCOUNTER — Encounter: Payer: Self-pay | Admitting: Obstetrics and Gynecology

## 2017-07-10 ENCOUNTER — Ambulatory Visit (INDEPENDENT_AMBULATORY_CARE_PROVIDER_SITE_OTHER): Payer: BLUE CROSS/BLUE SHIELD | Admitting: Obstetrics and Gynecology

## 2017-07-10 VITALS — BP 106/60 | HR 70 | Resp 16 | Ht 64.0 in | Wt 165.0 lb

## 2017-07-10 DIAGNOSIS — E7800 Pure hypercholesterolemia, unspecified: Secondary | ICD-10-CM | POA: Diagnosis not present

## 2017-07-10 DIAGNOSIS — Z01419 Encounter for gynecological examination (general) (routine) without abnormal findings: Secondary | ICD-10-CM | POA: Diagnosis not present

## 2017-07-10 DIAGNOSIS — D72829 Elevated white blood cell count, unspecified: Secondary | ICD-10-CM | POA: Diagnosis not present

## 2017-07-10 DIAGNOSIS — R7989 Other specified abnormal findings of blood chemistry: Secondary | ICD-10-CM

## 2017-07-10 DIAGNOSIS — T8332XA Displacement of intrauterine contraceptive device, initial encounter: Secondary | ICD-10-CM

## 2017-07-10 MED ORDER — NORETHINDRONE 0.35 MG PO TABS: 1.0000 | ORAL_TABLET | Freq: Every day | ORAL | 11 refills | Status: DC

## 2017-07-10 NOTE — Patient Instructions (Addendum)
EXERCISE AND DIET:  We recommended that you start or continue a regular exercise program for good health. Regular exercise means any activity that makes your heart beat faster and makes you sweat.  We recommend exercising at least 30 minutes per day at least 3 days a week, preferably 4 or 5.  We also recommend a diet low in fat and sugar.  Inactivity, poor dietary choices and obesity can cause diabetes, heart attack, stroke, and kidney damage, among others.    ALCOHOL AND SMOKING:  Women should limit their alcohol intake to no more than 7 drinks/beers/glasses of wine (combined, not each!) per week. Moderation of alcohol intake to this level decreases your risk of breast cancer and liver damage. And of course, no recreational drugs are part of a healthy lifestyle.  And absolutely no smoking or even second hand smoke. Most people know smoking can cause heart and lung diseases, but did you know it also contributes to weakening of your bones? Aging of your skin?  Yellowing of your teeth and nails?  CALCIUM AND VITAMIN D:  Adequate intake of calcium and Vitamin D are recommended.  The recommendations for exact amounts of these supplements seem to change often, but generally speaking 600 mg of calcium (either carbonate or citrate) and 800 units of Vitamin D per day seems prudent. Certain women may benefit from higher intake of Vitamin D.  If you are among these women, your doctor will have told you during your visit.    PAP SMEARS:  Pap smears, to check for cervical cancer or precancers,  have traditionally been done yearly, although recent scientific advances have shown that most women can have pap smears less often.  However, every woman still should have a physical exam from her gynecologist every year. It will include a breast check, inspection of the vulva and vagina to check for abnormal growths or skin changes, a visual exam of the cervix, and then an exam to evaluate the size and shape of the uterus and  ovaries.  And after 36 years of age, a rectal exam is indicated to check for rectal cancers. We will also provide age appropriate advice regarding health maintenance, like when you should have certain vaccines, screening for sexually transmitted diseases, bone density testing, colonoscopy, mammograms, etc.   MAMMOGRAMS:  All women over 54 years old should have a yearly mammogram. Many facilities now offer a "3D" mammogram, which may cost around $50 extra out of pocket. If possible,  we recommend you accept the option to have the 3D mammogram performed.  It both reduces the number of women who will be called back for extra views which then turn out to be normal, and it is better than the routine mammogram at detecting truly abnormal areas.    COLONOSCOPY:  Colonoscopy to screen for colon cancer is recommended for all women at age 17.  We know, you hate the idea of the prep.  We agree, BUT, having colon cancer and not knowing it is worse!!  Colon cancer so often starts as a polyp that can be seen and removed at colonscopy, which can quite literally save your life!  And if your first colonoscopy is normal and you have no family history of colon cancer, most women don't have to have it again for 10 years.  Once every ten years, you can do something that may end up saving your life, right?  We will be happy to help you get it scheduled when you are ready.  Be sure to check your insurance coverage so you understand how much it will cost.  It may be covered as a preventative service at no cost, but you should check your particular policy.     Start a prenatal vitamin, consider One a Day Prenatal Vitamin.  Norethindrone tablets (contraception) What is this medicine? NORETHINDRONE (nor eth IN drone) is an oral contraceptive. The product contains a female hormone known as a progestin. It is used to prevent pregnancy. This medicine may be used for other purposes; ask your health care provider or pharmacist if you  have questions. COMMON BRAND NAME(S): Camila, Deblitane 28-Day, Errin, Heather, [place], [place], [place], Nor-QD, Nora-BE, Norlyroc, Ortho Micronor, [employer] 28-Day What should I tell my health care provider before I take this medicine? They need to know if you have any of these conditions: -blood vessel disease or blood clots -breast, cervical, or vaginal cancer -diabetes -heart disease -kidney disease -liver disease -mental depression -migraine -seizures -stroke -vaginal bleeding -an unusual or allergic reaction to norethindrone, other medicines, foods, dyes, or preservatives -pregnant or trying to get pregnant -breast-feeding How should I use this medicine? Take this medicine by mouth with a glass of water. You may take it with or without food. Follow the directions on the prescription label. Take this medicine at the same time each day and in the order directed on the package. Do not take your medicine more often than directed. Contact your pediatrician regarding the use of this medicine in children. Special care may be needed. This medicine has been used in female children who have started having menstrual periods. A patient package insert for the product will be given with each prescription and refill. Read this sheet carefully each time. The sheet may change frequently. Overdosage: If you think you have taken too much of this medicine contact a poison control center or emergency room at once. NOTE: This medicine is only for you. Do not share this medicine with others. What if I miss a dose? Try not to miss a dose. Every time you miss a dose or take a dose late your chance of pregnancy increases. When 1 pill is missed (even if only 3 hours late), take the missed pill as soon as possible and continue taking a pill each day at the regular time (use a back up method of birth control for the next 48 hours). If more than 1 dose is missed, use an additional birth control method for the rest  of your pill pack until menses occurs. Contact your health care professional if more than 1 dose has been missed. What may interact with this medicine? Do not take this medicine with any of the following medications: -amprenavir or fosamprenavir -bosentan This medicine may also interact with the following medications: -antibiotics or medicines for infections, especially rifampin, rifabutin, rifapentine, and griseofulvin, and possibly penicillins or tetracyclines -aprepitant -barbiturate medicines, such as phenobarbital -carbamazepine -felbamate -modafinil -oxcarbazepine -phenytoin -ritonavir or other medicines for HIV infection or AIDS -St. John's wort -topiramate This list may not describe all possible interactions. Give your health care provider a list of all the medicines, herbs, non-prescription drugs, or dietary supplements you use. Also tell them if you smoke, drink alcohol, or use illegal drugs. Some items may interact with your medicine. What should I watch for while using this medicine? Visit your doctor or health care professional for regular checks on your progress. You will need a regular breast and pelvic exam and Pap smear while on this medicine. Use  an additional method of birth control during the first cycle that you take these tablets. If you have any reason to think you are pregnant, stop taking this medicine right away and contact your doctor or health care professional. If you are taking this medicine for hormone related problems, it may take several cycles of use to see improvement in your condition. This medicine does not protect you against HIV infection (AIDS) or any other sexually transmitted diseases. What side effects may I notice from receiving this medicine? Side effects that you should report to your doctor or health care professional as soon as possible: -breast tenderness or discharge -pain in the abdomen, chest, groin or leg -severe headache -skin rash,  itching, or hives -sudden shortness of breath -unusually weak or tired -vision or speech problems -yellowing of skin or eyes Side effects that usually do not require medical attention (report to your doctor or health care professional if they continue or are bothersome): -changes in sexual desire -change in menstrual flow -facial hair growth -fluid retention and swelling -headache -irritability -nausea -weight gain or loss This list may not describe all possible side effects. Call your doctor for medical advice about side effects. You may report side effects to FDA at 1-800-FDA-1088. Where should I keep my medicine? Keep out of the reach of children. Store at room temperature between 15 and 30 degrees C (59 and 86 degrees F). Throw away any unused medicine after the expiration date. NOTE: This sheet is a summary. It may not cover all possible information. If you have questions about this medicine, talk to your doctor, pharmacist, or health care provider.  2018 Elsevier/Gold Standard (2012-06-08 16:41:35)

## 2017-07-10 NOTE — Progress Notes (Signed)
36 y.o. G42P1001 Married Caucasian/Brazilian female here for annual exam.    Stopped all of her medications for Crohn's Disease.  Was having a lot of joint pain.   ROS negative.   PCP:   None  Patient's last menstrual period was 07/10/2017 (exact date).     Period Cycle (Days): 30 Period Duration (Days): 5-6 days Period Pattern: Regular Menstrual Flow: Heavy Menstrual Control: Maxi pad Menstrual Control Change Freq (Hours): every 4 hours on heaviest day Dysmenorrhea: None     Sexually active: Yes.    female The current method of family planning is IUD--Paragard inserted 08-01-16.    Exercising: Yes.    Kickboxing Smoker:  Yes - 3 - 4 per day.   Health Maintenance: Pap: 05-09-16 Neg:Neg HR HPV, 2015 Neg per pt. History of abnormal Pap:  no MMG:  n/a Colonoscopy:  01/2016 diagnosed with Crohns disease BMD:   n/a  Result  n/a TDaP:  Up to date per patient Gardasil:   no HIV: 2017 Neg Hep C:2017 Neg Screening Labs:    None.    reports that she quit smoking about 15 months ago. Her smoking use included Cigarettes. She has never used smokeless tobacco. She reports that she drinks about 1.2 oz of alcohol per week . She reports that she does not use drugs.  Past Medical History:  Diagnosis Date  . Crohn's disease ([place]) 01/2016  . Low vitamin D level 2017  . Panic disorder     Past Surgical History:  Procedure Laterality Date  . COSMETIC SURGERY     bilateral ear surgery    Current Outpatient Prescriptions  Medication Sig Dispense Refill  . PARAGARD INTRAUTERINE COPPER IU by Intrauterine route.    . Vitamin D, Ergocalciferol, (DRISDOL) 50000 units CAPS capsule Take 1 capsule (50,000 Units total) by mouth 2 (two) times a week. (Patient not taking: Reported on 07/10/2017) 24 capsule 0   No current facility-administered medications for this visit.     Family History  Problem Relation Age of Onset  . Cancer Father 47       prostate ca  . Diabetes Father   . Hypertension  Father   . Seizures Father        Parkinsons  . Stroke Father   . Thyroid disease Sister   . Diabetes Paternal Grandmother     ROS:  Pertinent items are noted in HPI.  Otherwise, a comprehensive ROS was negative.  Exam:   BP 106/60 (BP Location: Right Arm, Patient Position: Sitting, Cuff Size: Normal)   Pulse 70   Resp 16   Ht 5' 4"  (1.626 m)   Wt 165 lb (74.8 kg)   LMP 07/10/2017 (Exact Date)   BMI 28.32 kg/m     General appearance: alert, cooperative and appears stated age Head: Normocephalic, without obvious abnormality, atraumatic Neck: no adenopathy, supple, symmetrical, trachea midline and thyroid normal to inspection and palpation Lungs: clear to auscultation bilaterally Breasts: normal appearance, no masses or tenderness, No nipple retraction or dimpling, No nipple discharge or bleeding, No axillary or supraclavicular adenopathy Heart: regular rate and rhythm Abdomen: soft, non-tender; no masses, no organomegaly Extremities: extremities normal, atraumatic, no cyanosis or edema Skin: Skin color, texture, turgor normal. No rashes or lesions Lymph nodes: Cervical, supraclavicular, and axillary nodes normal. No abnormal inguinal nodes palpated Neurologic: Grossly normal  Pelvic: External genitalia:  no lesions              Urethra:  normal appearing urethra with no  masses, tenderness or lesions              Bartholins and Skenes: normal                 Vagina: normal appearing vagina with normal color and discharge, no lesions              Cervix: no lesions.  IUD string noted.               Pap taken: No. Bimanual Exam:  Uterus:  normal size, contour, position, consistency, mobility, non-tender.  IUD coming through the os.  IUD removed with ring forceps with patient's permission.               Adnexa: no mass, fullness, tenderness          Chaperone was present for exam.  Assessment:   Well woman visit with normal exam. Smoker.  Considering future childbearing.   Malpositioned IUD.  Expelling IUD. Hx low vit D level.  Plan: Mammogram screening discussed. Recommended self breast awareness. Pap and HR HPV as above. Guidelines for Calcium, Vitamin D, regular exercise program including cardiovascular and weight bearing exercise. Routine labs and vit D.  Start Vit D.  Start Micronor.  Instructed in use.  Referred to Smoking cessation classes through [facility].  We talked about the benefits of stopping smoking.  Follow up annually and prn.   After visit summary provided.

## 2017-07-11 LAB — LIPID PANEL
CHOLESTEROL TOTAL: 206 mg/dL — AB (ref 100–199)
Chol/HDL Ratio: 4.9 ratio — ABNORMAL HIGH (ref 0.0–4.4)
HDL: 42 mg/dL (ref >39)
LDL Calculated: 134 mg/dL — ABNORMAL HIGH (ref 0–99)
Triglycerides: 149 mg/dL (ref 0–149)
VLDL Cholesterol Cal: 30 mg/dL (ref 5–40)

## 2017-07-11 LAB — COMPREHENSIVE METABOLIC PANEL
A/G RATIO: 1.9 (ref 1.2–2.2)
ALT: 16 IU/L (ref 0–32)
AST: 13 IU/L (ref 0–40)
Albumin: 4.5 g/dL (ref 3.5–5.5)
Alkaline Phosphatase: 74 IU/L (ref 39–117)
BUN / CREAT RATIO: 13 (ref 9–23)
BUN: 10 mg/dL (ref 6–20)
CALCIUM: 9.3 mg/dL (ref 8.7–10.2)
CHLORIDE: 104 mmol/L (ref 96–106)
CO2: 21 mmol/L (ref 20–29)
Creatinine, Ser: 0.8 mg/dL (ref 0.57–1.00)
GFR, EST AFRICAN AMERICAN: 110 mL/min/{1.73_m2} (ref >59)
GFR, EST NON AFRICAN AMERICAN: 96 mL/min/{1.73_m2} (ref >59)
Globulin, Total: 2.4 g/dL (ref 1.5–4.5)
Glucose: 80 mg/dL (ref 65–99)
POTASSIUM: 4.7 mmol/L (ref 3.5–5.2)
Sodium: 141 mmol/L (ref 134–144)
TOTAL PROTEIN: 6.9 g/dL (ref 6.0–8.5)

## 2017-07-11 LAB — CBC
HEMATOCRIT: 38.3 % (ref 34.0–46.6)
HEMOGLOBIN: 13 g/dL (ref 11.1–15.9)
MCH: 28.7 pg (ref 26.6–33.0)
MCHC: 33.9 g/dL (ref 31.5–35.7)
MCV: 85 fL (ref 79–97)
Platelets: 324 10*3/uL (ref 150–379)
RBC: 4.53 x10E6/uL (ref 3.77–5.28)
RDW: 14.8 % (ref 12.3–15.4)
WBC: 11.6 10*3/uL — AB (ref 3.4–10.8)

## 2017-07-11 LAB — VITAMIN D 25 HYDROXY (VIT D DEFICIENCY, FRACTURES): Vit D, 25-Hydroxy: 18.5 ng/mL — ABNORMAL LOW (ref 30.0–100.0)

## 2017-07-12 ENCOUNTER — Telehealth: Payer: Self-pay | Admitting: *Deleted

## 2017-07-12 DIAGNOSIS — E559 Vitamin D deficiency, unspecified: Secondary | ICD-10-CM

## 2017-07-12 MED ORDER — VITAMIN D (ERGOCALCIFEROL) 1.25 MG (50000 UNIT) PO CAPS: 50000.0000 [IU] | ORAL_CAPSULE | ORAL | 0 refills | Status: DC

## 2017-07-12 NOTE — Telephone Encounter (Signed)
-----   Message from Nunzio Cobbs, MD sent at 07/12/2017  6:04 AM EDT ----- Please contact patient with her lab results. Her cholesterol is a little elevated with her LDL at 134.  The ratios of the good and cholesterol need to be improved to reduce risk of cardiovascular disease.  I am recommending a low fat/low cholesterol diet and vigorous exercise.   Her WBC was also elevated at 11.6.  This can happen with infection or if she was just nervous when she had her blood drawn.   Her blood chemistries were normal.   Her vitamin D level was low at 18.5. I am recommending vit D 50,000 weekly for 3 months.  Please send to pharmacy of choice.  I would like her to make an appointment for a lab recheck in 3 months.  I will place a future order for the CBC, vit D, and cholesterol panel.

## 2017-07-12 NOTE — Addendum Note (Signed)
Addended by: Yisroel Ramming, Garlin Batdorf E on: 07/12/2017 06:04 AM   Modules accepted: Orders

## 2017-07-12 NOTE — Telephone Encounter (Signed)
Spoke with patient and gave results and recommendations per Dr. Quincy Simmonds. RX for Vitamin D sent into pharmacy. Scheduled 3 month recheck of labs -eh

## 2017-07-12 NOTE — Telephone Encounter (Signed)
Left message to call regarding lab results -eh

## 2017-10-12 ENCOUNTER — Other Ambulatory Visit: Payer: Self-pay

## 2017-10-14 ENCOUNTER — Other Ambulatory Visit: Payer: Self-pay | Admitting: Obstetrics and Gynecology

## 2017-10-14 DIAGNOSIS — E559 Vitamin D deficiency, unspecified: Secondary | ICD-10-CM

## 2017-10-24 ENCOUNTER — Telehealth: Payer: Self-pay | Admitting: Obstetrics and Gynecology

## 2017-10-24 ENCOUNTER — Other Ambulatory Visit: Payer: Self-pay

## 2017-10-24 NOTE — Telephone Encounter (Signed)
Left message on voicemail regarding missed lab appointment.

## 2017-11-16 ENCOUNTER — Telehealth: Payer: Self-pay | Admitting: Obstetrics and Gynecology

## 2017-11-16 NOTE — Telephone Encounter (Signed)
Please contact patient to have her return for her lab recheck for vitamin D.  She came up in my Epic labs reminders.

## 2017-11-16 NOTE — Telephone Encounter (Signed)
Spoke with patient, advised as seen below per Dr. Quincy Simmonds. Patient states she did complete Vit D RX. Lab appt scheduled for 11/17/17 at 3:40pm. Patient verbalizes understanding and is agreeable.   Routing to provider for final review. Patient is agreeable to disposition. Will close encounter.

## 2017-11-17 ENCOUNTER — Other Ambulatory Visit (INDEPENDENT_AMBULATORY_CARE_PROVIDER_SITE_OTHER): Payer: BLUE CROSS/BLUE SHIELD

## 2017-11-17 DIAGNOSIS — R7989 Other specified abnormal findings of blood chemistry: Secondary | ICD-10-CM

## 2017-11-18 LAB — VITAMIN D 25 HYDROXY (VIT D DEFICIENCY, FRACTURES): Vit D, 25-Hydroxy: 25.2 ng/mL — ABNORMAL LOW (ref 30.0–100.0)

## 2017-11-24 ENCOUNTER — Other Ambulatory Visit: Payer: Self-pay | Admitting: Obstetrics and Gynecology

## 2017-11-24 DIAGNOSIS — E559 Vitamin D deficiency, unspecified: Secondary | ICD-10-CM

## 2018-01-05 ENCOUNTER — Telehealth: Payer: Self-pay | Admitting: Obstetrics and Gynecology

## 2018-01-05 NOTE — Telephone Encounter (Signed)
Patient has some questions about her vitamin d prescription.

## 2018-01-05 NOTE — Telephone Encounter (Signed)
Returned call to patient and left message for her to call me back.

## 2018-01-05 NOTE — Telephone Encounter (Signed)
Spoke with patient regarding her Vitamin D. She need to know that amount recommended. She was advised that Dr. Quincy Simmonds recommended 5000IU daily. Patient voiced understanding -eh

## 2018-02-14 ENCOUNTER — Other Ambulatory Visit: Payer: BLUE CROSS/BLUE SHIELD

## 2018-02-20 ENCOUNTER — Encounter (HOSPITAL_BASED_OUTPATIENT_CLINIC_OR_DEPARTMENT_OTHER): Payer: Self-pay | Admitting: *Deleted

## 2018-02-20 ENCOUNTER — Emergency Department (HOSPITAL_BASED_OUTPATIENT_CLINIC_OR_DEPARTMENT_OTHER)
Admission: EM | Admit: 2018-02-20 | Discharge: 2018-02-21 | Disposition: A | Discharge status: Home / Self Care | Payer: BLUE CROSS/BLUE SHIELD | Attending: Emergency Medicine | Admitting: Emergency Medicine

## 2018-02-20 ENCOUNTER — Other Ambulatory Visit: Payer: Self-pay

## 2018-02-20 DIAGNOSIS — R1013 Epigastric pain: Secondary | ICD-10-CM | POA: Diagnosis present

## 2018-02-20 DIAGNOSIS — Z87891 Personal history of nicotine dependence: Secondary | ICD-10-CM | POA: Insufficient documentation

## 2018-02-20 DIAGNOSIS — Z79899 Other long term (current) drug therapy: Secondary | ICD-10-CM | POA: Diagnosis not present

## 2018-02-20 DIAGNOSIS — J0110 Acute frontal sinusitis, unspecified: Secondary | ICD-10-CM

## 2018-02-20 LAB — COMPREHENSIVE METABOLIC PANEL
ALBUMIN: 4.1 g/dL (ref 3.5–5.0)
ALT: 16 U/L (ref 14–54)
AST: 20 U/L (ref 15–41)
Alkaline Phosphatase: 55 U/L (ref 38–126)
Anion gap: 11 (ref 5–15)
BUN: 14 mg/dL (ref 6–20)
CALCIUM: 9.2 mg/dL (ref 8.9–10.3)
CHLORIDE: 104 mmol/L (ref 101–111)
CO2: 23 mmol/L (ref 22–32)
CREATININE: 0.8 mg/dL (ref 0.44–1.00)
GFR calc Af Amer: >60 mL/min (ref >60)
GLUCOSE: 115 mg/dL — AB (ref 65–99)
Potassium: 3.8 mmol/L (ref 3.5–5.1)
Sodium: 138 mmol/L (ref 135–145)
TOTAL PROTEIN: 7.1 g/dL (ref 6.5–8.1)
Total Bilirubin: 0.2 mg/dL — ABNORMAL LOW (ref 0.3–1.2)

## 2018-02-20 LAB — CBC
HCT: 38.6 % (ref 36.0–46.0)
Hemoglobin: 13.5 g/dL (ref 12.0–15.0)
MCH: 30.4 pg (ref 26.0–34.0)
MCHC: 35 g/dL (ref 30.0–36.0)
MCV: 86.9 fL (ref 78.0–100.0)
PLATELETS: 311 10*3/uL (ref 150–400)
RBC: 4.44 MIL/uL (ref 3.87–5.11)
RDW: 12.8 % (ref 11.5–15.5)
WBC: 12.6 10*3/uL — AB (ref 4.0–10.5)

## 2018-02-20 LAB — URINALYSIS, ROUTINE W REFLEX MICROSCOPIC
Bilirubin Urine: NEGATIVE
Glucose, UA: NEGATIVE mg/dL
KETONES UR: NEGATIVE mg/dL
LEUKOCYTES UA: NEGATIVE
NITRITE: NEGATIVE
PROTEIN: NEGATIVE mg/dL
Specific Gravity, Urine: 1.025 (ref 1.005–1.030)
pH: 6 (ref 5.0–8.0)

## 2018-02-20 LAB — URINALYSIS, MICROSCOPIC (REFLEX)

## 2018-02-20 LAB — PREGNANCY, URINE: PREG TEST UR: NEGATIVE

## 2018-02-20 LAB — LIPASE, BLOOD: LIPASE: 26 U/L (ref 11–51)

## 2018-02-20 NOTE — ED Triage Notes (Signed)
Abdominal pain and nausea since this evening after taking garlic and ginger for allergies she has had x 2 months.

## 2018-02-20 NOTE — ED Notes (Signed)
Patient presents with c/o pain to the upper abd, denies lower abd pain.  Started approx 5pm.  +nausea no vomiting.

## 2018-02-21 MED ORDER — AZITHROMYCIN 250 MG PO TABS: ORAL_TABLET | ORAL | 0 refills | Status: DC

## 2018-02-21 NOTE — Discharge Instructions (Addendum)
Zithromax as prescribed.  Begin taking your omeprazole 20 mg once daily for the next 2 weeks.  Follow-up with your primary doctor if symptoms are not improving in the next week, and return to the ER if you develop worsening pain, high fevers, or other new and concerning symptoms.

## 2018-02-21 NOTE — ED Notes (Signed)
Discharge instructions and prescription reviewed - voiced understanding.

## 2018-02-21 NOTE — ED Provider Notes (Signed)
[facility] Provider Note   CSN: [id number] Arrival date & time: 02/20/18  2140     History   Chief Complaint Chief Complaint  Patient presents with  . Abdominal Pain    HPI Marisa Sharp is a 37 y.o. female.  This patient is a 37 year old female with past medical history of Crohn's disease for which she is off all medications and has been in remission for quite some time.  She presents today with complaints of sinus congestion and pressure that has been ongoing for the past 2 months.  This is been treated as allergies, however none of the medications she has been given have been helping.  Today she began with some discomfort to the epigastric region.  She has had similar discomfort in the past which has been treated successfully with omeprazole.  She denies any vomiting, diarrhea, or constipation.  She denies any fevers or chills.  The history is provided by the patient.  Abdominal Pain   This is a new problem. The current episode started yesterday. The problem occurs constantly. The problem has been gradually worsening. Associated with: Medications. The pain is located in the epigastric region. The quality of the pain is burning. The pain is mild. Associated symptoms include headaches. Pertinent negatives include fever, vomiting and constipation. Exacerbated by: Certain medications. Nothing relieves the symptoms.    Past Medical History:  Diagnosis Date  . Crohn's disease ([place]) 01/2016  . Low vitamin D level 2017  . Panic disorder     Patient Active Problem List   Diagnosis Date Noted  . Right shoulder injury 09/18/2015    Past Surgical History:  Procedure Laterality Date  . COSMETIC SURGERY     bilateral ear surgery     OB History    Gravida  1   Para  1   Term  1   Preterm      AB      Living  1     SAB      TAB      Ectopic      Multiple      Live Births               Home Medications    Prior to  Admission medications   Medication Sig Start Date End Date Taking? Authorizing Provider  norethindrone (MICRONOR,CAMILA,ERRIN) 0.35 MG tablet Take 1 tablet (0.35 mg total) by mouth daily. 07/10/17   Nunzio Cobbs, MD  PARAGARD INTRAUTERINE COPPER IU by Intrauterine route.    [provider]  Vitamin D, Ergocalciferol, (DRISDOL) 50000 units CAPS capsule Take 1 capsule (50,000 Units total) by mouth every 7 (seven) days. 07/12/17   Nunzio Cobbs, MD    Family History Family History  Problem Relation Age of Onset  . Cancer Father 60       prostate ca  . Diabetes Father   . Hypertension Father   . Seizures Father        Parkinsons  . Stroke Father   . Thyroid disease Sister   . Diabetes Paternal Grandmother     Social History Social History   Tobacco Use  . Smoking status: Former Smoker    Types: Cigarettes    Last attempt to quit: 04/08/2016    Years since quitting: 1.8  . Smokeless tobacco: Never Used  Substance Use Topics  . Alcohol use: Yes    Alcohol/week: 1.2 oz    Types: 2 Glasses  of wine per week    Comment: drinks on weekends  . Drug use: No     Allergies   Penicillins   Review of Systems Review of Systems  Constitutional: Negative for fever.  Gastrointestinal: Positive for abdominal pain. Negative for constipation and vomiting.  Neurological: Positive for headaches.  All other systems reviewed and are negative.    Physical Exam Updated Vital Signs BP 115/85 (BP Location: Right Arm)   Pulse 67   Temp 98.3 F (36.8 C) (Oral)   Resp 18   LMP 01/30/2018   SpO2 100%   Physical Exam  Constitutional: She is oriented to person, place, and time. She appears well-developed and well-nourished. No distress.  HENT:  Head: Normocephalic and atraumatic.  Neck: Normal range of motion. Neck supple.  Cardiovascular: Normal rate and regular rhythm. Exam reveals no gallop and no friction rub.  No murmur heard. Pulmonary/Chest: Effort  normal and breath sounds normal. No respiratory distress. She has no wheezes.  Abdominal: Soft. Bowel sounds are normal. She exhibits no distension. There is tenderness in the epigastric area. There is no rigidity, no rebound and no guarding.  There is mild tenderness to palpation in the epigastric region.  Musculoskeletal: Normal range of motion.  Neurological: She is alert and oriented to person, place, and time.  Skin: Skin is warm and dry. She is not diaphoretic.  Nursing note and vitals reviewed.    ED Treatments / Results  Labs (all labs ordered are listed, but only abnormal results are displayed) Labs Reviewed  COMPREHENSIVE METABOLIC PANEL - Abnormal; Notable for the following components:      Result Value   Glucose, Bld 115 (*)    Total Bilirubin 0.2 (*)    All other components within normal limits  CBC - Abnormal; Notable for the following components:   WBC 12.6 (*)    All other components within normal limits  URINALYSIS, ROUTINE W REFLEX MICROSCOPIC - Abnormal; Notable for the following components:   Hgb urine dipstick TRACE (*)    All other components within normal limits  URINALYSIS, MICROSCOPIC (REFLEX) - Abnormal; Notable for the following components:   Bacteria, UA RARE (*)    All other components within normal limits  LIPASE, BLOOD  PREGNANCY, URINE    EKG None  Radiology No results found.  Procedures Procedures (including critical care time)  Medications Ordered in ED Medications - No data to display   Initial Impression / Assessment and Plan / ED Course  I have reviewed the triage vital signs and the nursing notes.  Pertinent labs & imaging results that were available during my care of the patient were reviewed by me and considered in my medical decision making (see chart for details).  Patient describes sinus congestion and drainage for the past 2 months.  This is been treated as allergies, however is not helping.  I believe is though an  antibiotic is indicated and she will be prescribed Zithromax.  She is also experiencing abdominal discomfort which I suspect is related to the many medication she has been prescribed for her allergies.  She does have a history of gastritis/reflux and I will have her take her omeprazole daily for the next 2 weeks as I believe this is the cause of her discomfort.  Her laboratory studies are reassuring and physical examination is otherwise unremarkable.  Final Clinical Impressions(s) / ED Diagnoses   Final diagnoses:  None    ED Discharge Orders    None  Veryl Speak, MD 02/21/18 [id number]

## 2018-02-23 ENCOUNTER — Other Ambulatory Visit (INDEPENDENT_AMBULATORY_CARE_PROVIDER_SITE_OTHER): Payer: BLUE CROSS/BLUE SHIELD

## 2018-02-23 ENCOUNTER — Other Ambulatory Visit: Payer: Self-pay

## 2018-02-23 DIAGNOSIS — R7989 Other specified abnormal findings of blood chemistry: Secondary | ICD-10-CM

## 2018-02-24 LAB — VITAMIN D 25 HYDROXY (VIT D DEFICIENCY, FRACTURES): VIT D 25 HYDROXY: 37.7 ng/mL (ref 30.0–100.0)

## 2018-02-27 ENCOUNTER — Encounter: Payer: Self-pay | Admitting: Obstetrics and Gynecology

## 2018-03-06 ENCOUNTER — Telehealth: Payer: Self-pay | Admitting: Obstetrics and Gynecology

## 2018-03-06 NOTE — Telephone Encounter (Signed)
Patient was told that her Vit D level is OK, and that she can take 1052m/day, but she has a lot of 50050mpills left and wants to know if she can take 1 of the 500021meek.

## 2018-03-06 NOTE — Telephone Encounter (Signed)
Left message to call Kaitlyn at [phone]. 

## 2018-03-06 NOTE — Telephone Encounter (Signed)
Dr.Silva for advise. Vitamin D was 37.7 02/23/2018.

## 2018-03-06 NOTE — Telephone Encounter (Signed)
Ok to take vitamin D 5000 IU weekly until her medication runs out.  Then take vitamin D 1000 IU daily.

## 2018-03-16 ENCOUNTER — Ambulatory Visit: Payer: BLUE CROSS/BLUE SHIELD | Admitting: Allergy & Immunology

## 2018-03-16 NOTE — Telephone Encounter (Signed)
Left message to call Kaitlyn at [phone]. 

## 2018-03-22 NOTE — Telephone Encounter (Signed)
Left detailed message at number provided with message as seen below from [place], okay per ROI. Encounter closed.

## 2018-05-03 DIAGNOSIS — K8020 Calculus of gallbladder without cholecystitis without obstruction: Secondary | ICD-10-CM

## 2018-05-03 HISTORY — DX: Calculus of gallbladder without cholecystitis without obstruction: K80.20

## 2018-05-11 LAB — OB RESULTS CONSOLE GC/CHLAMYDIA
Chlamydia: NEGATIVE
Gonorrhea: NEGATIVE

## 2018-05-11 LAB — OB RESULTS CONSOLE RPR: RPR: NONREACTIVE

## 2018-05-11 LAB — OB RESULTS CONSOLE ABO/RH: RH Type: POSITIVE

## 2018-05-11 LAB — OB RESULTS CONSOLE HEPATITIS B SURFACE ANTIGEN: Hepatitis B Surface Ag: NEGATIVE

## 2018-05-11 LAB — OB RESULTS CONSOLE RUBELLA ANTIBODY, IGM: Rubella: IMMUNE

## 2018-05-11 LAB — OB RESULTS CONSOLE HIV ANTIBODY (ROUTINE TESTING): HIV: NONREACTIVE

## 2018-05-11 LAB — OB RESULTS CONSOLE ANTIBODY SCREEN: Antibody Screen: NEGATIVE

## 2018-05-14 ENCOUNTER — Other Ambulatory Visit (HOSPITAL_COMMUNITY): Payer: Self-pay | Admitting: Obstetrics & Gynecology

## 2018-05-14 DIAGNOSIS — R1013 Epigastric pain: Principal | ICD-10-CM

## 2018-05-14 DIAGNOSIS — R1110 Vomiting, unspecified: Secondary | ICD-10-CM

## 2018-05-14 DIAGNOSIS — O26899 Other specified pregnancy related conditions, unspecified trimester: Secondary | ICD-10-CM

## 2018-05-16 ENCOUNTER — Ambulatory Visit (HOSPITAL_COMMUNITY)
Admission: RE | Admit: 2018-05-16 | Discharge: 2018-05-16 | Disposition: A | Discharge status: Home / Self Care | Payer: BLUE CROSS/BLUE SHIELD | Source: Ambulatory Visit | Attending: Obstetrics & Gynecology | Admitting: Obstetrics & Gynecology

## 2018-05-16 DIAGNOSIS — Z3A10 10 weeks gestation of pregnancy: Secondary | ICD-10-CM | POA: Diagnosis not present

## 2018-05-16 DIAGNOSIS — O26891 Other specified pregnancy related conditions, first trimester: Secondary | ICD-10-CM | POA: Insufficient documentation

## 2018-05-16 DIAGNOSIS — R1013 Epigastric pain: Secondary | ICD-10-CM

## 2018-05-16 DIAGNOSIS — O09521 Supervision of elderly multigravida, first trimester: Secondary | ICD-10-CM | POA: Insufficient documentation

## 2018-05-16 DIAGNOSIS — K8020 Calculus of gallbladder without cholecystitis without obstruction: Secondary | ICD-10-CM | POA: Diagnosis not present

## 2018-05-16 DIAGNOSIS — R1110 Vomiting, unspecified: Secondary | ICD-10-CM

## 2018-05-16 DIAGNOSIS — O26899 Other specified pregnancy related conditions, unspecified trimester: Secondary | ICD-10-CM

## 2018-06-02 ENCOUNTER — Encounter (HOSPITAL_BASED_OUTPATIENT_CLINIC_OR_DEPARTMENT_OTHER): Payer: Self-pay

## 2018-06-02 ENCOUNTER — Other Ambulatory Visit: Payer: Self-pay

## 2018-06-02 ENCOUNTER — Emergency Department (HOSPITAL_BASED_OUTPATIENT_CLINIC_OR_DEPARTMENT_OTHER)
Admission: EM | Admit: 2018-06-02 | Discharge: 2018-06-02 | Disposition: A | Discharge status: Home / Self Care | Payer: BLUE CROSS/BLUE SHIELD | Attending: Emergency Medicine | Admitting: Emergency Medicine

## 2018-06-02 DIAGNOSIS — R1011 Right upper quadrant pain: Secondary | ICD-10-CM | POA: Diagnosis not present

## 2018-06-02 DIAGNOSIS — O9989 Other specified diseases and conditions complicating pregnancy, childbirth and the puerperium: Secondary | ICD-10-CM | POA: Insufficient documentation

## 2018-06-02 DIAGNOSIS — R1031 Right lower quadrant pain: Secondary | ICD-10-CM | POA: Insufficient documentation

## 2018-06-02 DIAGNOSIS — Z79899 Other long term (current) drug therapy: Secondary | ICD-10-CM | POA: Diagnosis not present

## 2018-06-02 DIAGNOSIS — Z3A13 13 weeks gestation of pregnancy: Secondary | ICD-10-CM | POA: Diagnosis not present

## 2018-06-02 DIAGNOSIS — R110 Nausea: Secondary | ICD-10-CM | POA: Insufficient documentation

## 2018-06-02 DIAGNOSIS — R1013 Epigastric pain: Secondary | ICD-10-CM | POA: Insufficient documentation

## 2018-06-02 DIAGNOSIS — Z87891 Personal history of nicotine dependence: Secondary | ICD-10-CM | POA: Diagnosis not present

## 2018-06-02 LAB — COMPREHENSIVE METABOLIC PANEL
ALBUMIN: 3.6 g/dL (ref 3.5–5.0)
ALT: 17 U/L (ref 0–44)
AST: 21 U/L (ref 15–41)
Alkaline Phosphatase: 47 U/L (ref 38–126)
Anion gap: 9 (ref 5–15)
BUN: 8 mg/dL (ref 6–20)
CO2: 24 mmol/L (ref 22–32)
CREATININE: 0.59 mg/dL (ref 0.44–1.00)
Calcium: 9.9 mg/dL (ref 8.9–10.3)
Chloride: 106 mmol/L (ref 98–111)
GFR calc Af Amer: >60 mL/min (ref >60)
GFR calc non Af Amer: >60 mL/min (ref >60)
GLUCOSE: 120 mg/dL — AB (ref 70–99)
Potassium: 4.3 mmol/L (ref 3.5–5.1)
SODIUM: 139 mmol/L (ref 135–145)
Total Bilirubin: 0.4 mg/dL (ref 0.3–1.2)
Total Protein: 6.9 g/dL (ref 6.5–8.1)

## 2018-06-02 LAB — CBC WITH DIFFERENTIAL/PLATELET
BASOS ABS: 0 10*3/uL (ref 0.0–0.1)
Basophils Relative: 0 %
EOS ABS: 0.1 10*3/uL (ref 0.0–0.7)
EOS PCT: 1 %
HCT: 33.8 % — ABNORMAL LOW (ref 36.0–46.0)
HEMOGLOBIN: 11.7 g/dL — AB (ref 12.0–15.0)
Lymphocytes Relative: 12 %
Lymphs Abs: 1.5 10*3/uL (ref 0.7–4.0)
MCH: 29.9 pg (ref 26.0–34.0)
MCHC: 34.6 g/dL (ref 30.0–36.0)
MCV: 86.4 fL (ref 78.0–100.0)
Monocytes Absolute: 0.5 10*3/uL (ref 0.1–1.0)
Monocytes Relative: 4 %
NEUTROS PCT: 83 %
Neutro Abs: 10.7 10*3/uL — ABNORMAL HIGH (ref 1.7–7.7)
PLATELETS: 266 10*3/uL (ref 150–400)
RBC: 3.91 MIL/uL (ref 3.87–5.11)
RDW: 12.4 % (ref 11.5–15.5)
WBC: 12.9 10*3/uL — AB (ref 4.0–10.5)

## 2018-06-02 LAB — LIPASE, BLOOD: Lipase: 24 U/L (ref 11–51)

## 2018-06-02 MED ORDER — MORPHINE SULFATE (PF) 4 MG/ML IV SOLN
4.0000 mg | Freq: Once | INTRAVENOUS | Status: DC
  Filled 2018-06-02: qty 1

## 2018-06-02 MED ORDER — SODIUM CHLORIDE 0.9 % IV BOLUS
1000.0000 mL | Freq: Once | INTRAVENOUS | Status: AC
  Administered 2018-06-02: 1000 mL via INTRAVENOUS

## 2018-06-02 NOTE — ED Notes (Signed)
Pt sts pain is controlled at this time and has decreased since arrival to ED.

## 2018-06-02 NOTE — ED Triage Notes (Signed)
Reports abdominal pain-RUQ radiating into back. Known gallstones with US done today. States that she is [redacted] weeks pregnant and was told that nothing could be done until after pregnancy. Denies vaginal discharge.

## 2018-06-02 NOTE — ED Provider Notes (Signed)
[facility] EMERGENCY DEPARTMENT Provider Note  CSN: [id number] Arrival date & time: 06/02/18 0134  Chief Complaint(s) Abdominal Pain  HPI Marisa Sharp is a 37 y.o. female   The history is provided by the patient.  Abdominal Pain   This is a new problem. Episode onset: 2 months. Episode frequency: intermittent; usually at night. Progression since onset: fluctuating. The pain is located in the epigastric region, RUQ and RLQ (migratory (epigastrum, RUQ, and RLQ)). The quality of the pain is aching. The pain is severe. Associated symptoms include nausea. Pertinent negatives include fever, diarrhea, hematochezia, melena, vomiting, constipation and dysuria. Nothing aggravates the symptoms. The symptoms are relieved by activity. Her past medical history is significant for gallstones.   Currently [redacted] weeks pregnant.  Patient was evaluated by her obstetrician for the same several weeks ago had an ultrasound which revealed cholelithiasis without acute cholecystitis.   Past Medical History Past Medical History:  Diagnosis Date  . Crohn's disease ([place]) 01/2016  . Low vitamin D level 2017  . Panic disorder    Patient Active Problem List   Diagnosis Date Noted  . Right shoulder injury 09/18/2015   Home Medication(s) Prior to Admission medications   Medication Sig Start Date End Date Taking? Authorizing Provider  azithromycin (ZITHROMAX Z-PAK) 250 MG tablet 2 po day one, then 1 daily x 4 days 02/21/18   Veryl Speak, MD  norethindrone (MICRONOR,CAMILA,ERRIN) 0.35 MG tablet Take 1 tablet (0.35 mg total) by mouth daily. 07/10/17   Nunzio Cobbs, MD  PARAGARD INTRAUTERINE COPPER IU by Intrauterine route.    [provider]  Vitamin D, Ergocalciferol, (DRISDOL) 50000 units CAPS capsule Take 1 capsule (50,000 Units total) by mouth every 7 (seven) days. 07/12/17   Nunzio Cobbs, MD                                                          Past Surgical History Past Surgical History:  Procedure Laterality Date  . COSMETIC SURGERY     bilateral ear surgery   Family History Family History  Problem Relation Age of Onset  . Cancer Father 43       prostate ca  . Diabetes Father   . Hypertension Father   . Seizures Father        Parkinsons  . Stroke Father   . Thyroid disease Sister   . Diabetes Paternal Grandmother     Social History Social History   Tobacco Use  . Smoking status: Former Smoker    Types: Cigarettes    Last attempt to quit: 04/08/2016    Years since quitting: 2.1  . Smokeless tobacco: Never Used  Substance Use Topics  . Alcohol use: Yes    Alcohol/week: 2.0 standard drinks    Types: 2 Glasses of wine per week    Comment: drinks on weekends  . Drug use: No   Allergies Penicillins  Review of Systems Review of Systems  Constitutional: Negative for fever.  Gastrointestinal: Positive for abdominal pain and nausea. Negative for constipation, diarrhea, hematochezia, melena and vomiting.  Genitourinary: Negative for dysuria.   All other systems are reviewed and are negative for acute change except as noted in the HPI  Physical Exam Vital Signs  I have reviewed the triage vital signs BP  104/74 (BP Location: Left Arm)   Pulse 69   Temp 98.3 F (36.8 C) (Oral)   Resp 16   Ht 5' 1"  (1.549 m)   Wt 75.3 kg   LMP 01/30/2018   SpO2 100%   BMI 31.37 kg/m   Physical Exam  Constitutional: She is oriented to person, place, and time. She appears well-developed and well-nourished. No distress.  HENT:  Head: Normocephalic and atraumatic.  Right Ear: External ear normal.  Left Ear: External ear normal.  Nose: Nose normal.  Eyes: Conjunctivae and EOM are normal. No scleral icterus.  Neck: Normal range of motion and phonation normal.  Cardiovascular: Normal rate and regular rhythm.  Pulmonary/Chest: Effort normal. No stridor. No respiratory  distress.  Abdominal: She exhibits no distension. There is tenderness in the right upper quadrant and epigastric area. There is no rigidity, no rebound, no guarding and negative Murphy's sign.  gravid  Musculoskeletal: Normal range of motion. She exhibits no edema.  Neurological: She is alert and oriented to person, place, and time.  Skin: She is not diaphoretic.  Psychiatric: She has a normal mood and affect. Her behavior is normal.  Vitals reviewed.   ED Results and Treatments Labs (all labs ordered are listed, but only abnormal results are displayed) Labs Reviewed  CBC WITH DIFFERENTIAL/PLATELET - Abnormal; Notable for the following components:      Result Value   WBC 12.9 (*)    Hemoglobin 11.7 (*)    HCT 33.8 (*)    Neutro Abs 10.7 (*)    All other components within normal limits  COMPREHENSIVE METABOLIC PANEL - Abnormal; Notable for the following components:   Glucose, Bld 120 (*)    All other components within normal limits  LIPASE, BLOOD                                                                                                                         EKG  EKG Interpretation  Date/Time:    Ventricular Rate:    PR Interval:    QRS Duration:   QT Interval:    QTC Calculation:   R Axis:     Text Interpretation:        Radiology No results found. Pertinent labs & imaging results that were available during my care of the patient were reviewed by me and considered in my medical decision making (see chart for details).  Medications Ordered in ED Medications  morphine 4 MG/ML injection 4 mg (4 mg Intravenous Refused 06/02/18 0338)  sodium chloride 0.9 % bolus 1,000 mL (0 mLs Intravenous Stopped 06/02/18 0442)  Procedures Procedures  EMERGENCY DEPARTMENT BILIARY ULTRASOUND INTERPRETATION "Study: Limited Abdominal Ultrasound of the  Gallbladder and Common Bile Duct."  INDICATIONS: Abdominal pain Indication: Multiple views of the gallbladder and common bile duct were obtained in real-time with a Multi-frequency probe."  PERFORMED BY:  Myself IMAGES ARCHIVED?: Yes LIMITATIONS: Body habitus and Bowel gas INTERPRETATION: Gallbladder wall normal in thickness, Sonographic Murphy's sign abscent and sludge noted. no obvious stones   (including critical care time)  Medical Decision Making / ED Course I have reviewed the nursing notes for this encounter and the patient's prior records (if available in EHR or on provided paperwork).    Epigastric and right upper quadrant pain with tenderness to palpation.  No peritonitis or Murphy sign.  Labs with stable leukocytosis.  No significant electrolyte derangements or renal insufficiency.  No evidence of biliary obstruction or pancreatitis.  Provided with IV fluids and single dose of morphine resulting in significant improvement.  Bedside ultrasound did not reveal any large stones but did notice sludge.  No evidence of obstruction or acute cholecystitis at this time.  Patient has a history of Crohn's but states that she is been in remission for several years.  Denies any melena or hematochezia.  Improved abd exam.  The patient appears reasonably screened and/or stabilized for discharge and I doubt any other medical condition or other [facility] requiring further screening, evaluation, or treatment in the ED at this time prior to discharge.  The patient is safe for discharge with strict return precautions.   Final Clinical Impression(s) / ED Diagnoses Final diagnoses:  RUQ pain    Disposition: Discharge  Condition: Good  I have discussed the results, Dx and Tx plan with the patient who expressed understanding and agree(s) with the plan. Discharge instructions discussed at great length. The patient was given strict return precautions who verbalized understanding of the instructions.  No further questions at time of discharge.    ED Discharge Orders    None       Follow Up: Rich Fuchs, [place] [id number] [phone]  Schedule an appointment as soon as possible for a visit  As needed     This chart was dictated using voice recognition software.  Despite best efforts to proofread,  errors can occur which can change the documentation meaning.   Fatima Blank, MD 06/02/18 [phone]

## 2018-07-13 ENCOUNTER — Ambulatory Visit: Payer: BLUE CROSS/BLUE SHIELD | Admitting: Obstetrics and Gynecology

## 2018-08-26 ENCOUNTER — Inpatient Hospital Stay (HOSPITAL_COMMUNITY)
Admission: AD | Admit: 2018-08-26 | Discharge: 2018-08-26 | Disposition: A | Discharge status: Home / Self Care | Payer: BLUE CROSS/BLUE SHIELD | Source: Ambulatory Visit | Attending: Obstetrics and Gynecology | Admitting: Obstetrics and Gynecology

## 2018-08-26 ENCOUNTER — Inpatient Hospital Stay (HOSPITAL_COMMUNITY): Payer: BLUE CROSS/BLUE SHIELD

## 2018-08-26 ENCOUNTER — Encounter (HOSPITAL_COMMUNITY): Payer: Self-pay

## 2018-08-26 DIAGNOSIS — Z3A25 25 weeks gestation of pregnancy: Secondary | ICD-10-CM | POA: Insufficient documentation

## 2018-08-26 DIAGNOSIS — O99612 Diseases of the digestive system complicating pregnancy, second trimester: Secondary | ICD-10-CM | POA: Diagnosis not present

## 2018-08-26 DIAGNOSIS — O26892 Other specified pregnancy related conditions, second trimester: Secondary | ICD-10-CM | POA: Diagnosis not present

## 2018-08-26 DIAGNOSIS — K5090 Crohn's disease, unspecified, without complications: Secondary | ICD-10-CM | POA: Insufficient documentation

## 2018-08-26 DIAGNOSIS — K8070 Calculus of gallbladder and bile duct without cholecystitis without obstruction: Secondary | ICD-10-CM

## 2018-08-26 DIAGNOSIS — Z87891 Personal history of nicotine dependence: Secondary | ICD-10-CM | POA: Diagnosis not present

## 2018-08-26 DIAGNOSIS — R1011 Right upper quadrant pain: Secondary | ICD-10-CM

## 2018-08-26 DIAGNOSIS — O26612 Liver and biliary tract disorders in pregnancy, second trimester: Secondary | ICD-10-CM | POA: Insufficient documentation

## 2018-08-26 DIAGNOSIS — K929 Disease of digestive system, unspecified: Secondary | ICD-10-CM | POA: Insufficient documentation

## 2018-08-26 LAB — COMPREHENSIVE METABOLIC PANEL
ALBUMIN: 3.2 g/dL — AB (ref 3.5–5.0)
ALT: 14 U/L (ref 0–44)
AST: 20 U/L (ref 15–41)
Alkaline Phosphatase: 56 U/L (ref 38–126)
Anion gap: 10 (ref 5–15)
BILIRUBIN TOTAL: 0.5 mg/dL (ref 0.3–1.2)
BUN: 7 mg/dL (ref 6–20)
CO2: 22 mmol/L (ref 22–32)
Calcium: 8.9 mg/dL (ref 8.9–10.3)
Chloride: 105 mmol/L (ref 98–111)
Creatinine, Ser: 0.63 mg/dL (ref 0.44–1.00)
GFR calc Af Amer: >60 mL/min (ref >60)
GFR calc non Af Amer: >60 mL/min (ref >60)
GLUCOSE: 98 mg/dL (ref 70–99)
POTASSIUM: 4.3 mmol/L (ref 3.5–5.1)
SODIUM: 137 mmol/L (ref 135–145)
TOTAL PROTEIN: 7 g/dL (ref 6.5–8.1)

## 2018-08-26 LAB — URINALYSIS, ROUTINE W REFLEX MICROSCOPIC
BILIRUBIN URINE: NEGATIVE
Glucose, UA: NEGATIVE mg/dL
Hgb urine dipstick: NEGATIVE
Ketones, ur: 80 mg/dL — AB
Leukocytes, UA: NEGATIVE
NITRITE: NEGATIVE
PROTEIN: NEGATIVE mg/dL
SPECIFIC GRAVITY, URINE: 1.021 (ref 1.005–1.030)
pH: 6 (ref 5.0–8.0)

## 2018-08-26 LAB — CBC
HEMATOCRIT: 33.7 % — AB (ref 36.0–46.0)
HEMOGLOBIN: 11.1 g/dL — AB (ref 12.0–15.0)
MCH: 30 pg (ref 26.0–34.0)
MCHC: 32.9 g/dL (ref 30.0–36.0)
MCV: 91.1 fL (ref 80.0–100.0)
NRBC: 0 % (ref 0.0–0.2)
Platelets: 278 10*3/uL (ref 150–400)
RBC: 3.7 MIL/uL — ABNORMAL LOW (ref 3.87–5.11)
RDW: 13.8 % (ref 11.5–15.5)
WBC: 20.2 10*3/uL — AB (ref 4.0–10.5)

## 2018-08-26 LAB — SAMPLE TO BLOOD BANK

## 2018-08-26 LAB — AMYLASE: AMYLASE: 64 U/L (ref 28–100)

## 2018-08-26 LAB — LIPASE, BLOOD: LIPASE: 18 U/L (ref 11–51)

## 2018-08-26 MED ORDER — PROMETHAZINE HCL 25 MG PO TABS
25.0000 mg | ORAL_TABLET | Freq: Four times a day (QID) | ORAL | Status: DC | PRN
  Administered 2018-08-26: 25 mg via ORAL
  Filled 2018-08-26: qty 1

## 2018-08-26 MED ORDER — OXYCODONE-ACETAMINOPHEN 5-325 MG PO TABS: 1.0000 | ORAL_TABLET | Freq: Four times a day (QID) | ORAL | 0 refills | Status: DC | PRN

## 2018-08-26 MED ORDER — PROMETHAZINE HCL 25 MG PO TABS: 25.0000 mg | ORAL_TABLET | Freq: Four times a day (QID) | ORAL | 0 refills | Status: DC | PRN

## 2018-08-26 MED ORDER — OXYCODONE-ACETAMINOPHEN 5-325 MG PO TABS
1.0000 | ORAL_TABLET | Freq: Once | ORAL | Status: AC
  Administered 2018-08-26: 1 via ORAL
  Filled 2018-08-26: qty 1

## 2018-08-26 NOTE — MAU Provider Note (Signed)
Chief Complaint:  Abdominal Pain   First Provider Initiated Contact with Patient 08/26/18 1410     HPI: Marisa Sharp is a 37 y.o. G2P1001 at 5w2dho presents to maternity admissions reporting pain from a "gallstone attack" last night and pain continues into today, though less severe. Has some nausea, no vomiting.  Has been eating a low fat diet but did eat chocolate cake yesterday. . She reports good fetal movement, denies LOF, vaginal bleeding, vaginal itching/burning, urinary symptoms, h/a, dizziness,  diarrhea, constipation or fever/chills.    Abdominal Pain  This is a recurrent problem. The current episode started yesterday. The problem occurs intermittently. The problem has been gradually improving. The pain is located in the RUQ. The quality of the pain is colicky and cramping. The abdominal pain does not radiate. Associated symptoms include nausea. Pertinent negatives include no constipation, diarrhea, dysuria, fever, headaches, myalgias or vomiting. The pain is aggravated by eating. The pain is relieved by nothing. She has tried nothing for the symptoms.   RN note; Has gallstones and had an attack last night, states it usually goes away but the pain is still there. Unable to eat  Past Medical History: Past Medical History:  Diagnosis Date  . Crohn's disease ([place] 01/2016  . Low vitamin D level 2017  . Panic disorder     Past obstetric history: OB History  Gravida Para Term Preterm AB Living  2 1 1     1   SAB TAB Ectopic Multiple Live Births               # Outcome Date GA Lbr Len/2nd Weight Sex Delivery Anes PTL Lv  2 Current           1 Term             Past Surgical History: Past Surgical History:  Procedure Laterality Date  . COSMETIC SURGERY     bilateral ear surgery    Family History: Family History  Problem Relation Age of Onset  . Cancer Father 675      prostate ca  . Diabetes Father   . Hypertension Father   . Seizures Father        Parkinsons  .  Stroke Father   . Thyroid disease Sister   . Diabetes Paternal Grandmother     Social History: Social History   Tobacco Use  . Smoking status: Former Smoker    Types: Cigarettes    Last attempt to quit: 04/08/2016    Years since quitting: 2.3  . Smokeless tobacco: Never Used  Substance Use Topics  . Alcohol use: Yes    Alcohol/week: 2.0 standard drinks    Types: 2 Glasses of wine per week    Comment: drinks on weekends  . Drug use: No    Allergies:  Allergies  Allergen Reactions  . Penicillins     Meds:  Medications Prior to Admission  Medication Sig Dispense Refill Last Dose  . azithromycin (ZITHROMAX Z-PAK) 250 MG tablet 2 po day one, then 1 daily x 4 days 6 tablet 0   . norethindrone (MICRONOR,CAMILA,ERRIN) 0.35 MG tablet Take 1 tablet (0.35 mg total) by mouth daily. 1 Package 11   . PARAGARD INTRAUTERINE COPPER IU by Intrauterine route.   Taking  . Vitamin D, Ergocalciferol, (DRISDOL) 50000 units CAPS capsule Take 1 capsule (50,000 Units total) by mouth every 7 (seven) days. 12 capsule 0     I have reviewed patient's Past Medical Hx, Surgical Hx, Family  Hx, Social Hx, medications and allergies.   ROS:  Review of Systems  Constitutional: Negative for fever.  Gastrointestinal: Positive for abdominal pain and nausea. Negative for constipation, diarrhea and vomiting.  Genitourinary: Negative for dysuria.  Musculoskeletal: Negative for myalgias.  Neurological: Negative for headaches.   Other systems negative  Physical Exam   Patient Vitals for the past 24 hrs:  BP Temp Temp src Pulse Resp SpO2 Height Weight  08/26/18 1348 122/74 (!) 97.5 F (36.4 C) Oral 86 18 99 % 5' 3"  (1.6 m) 78.8 kg   Constitutional: Well-developed, well-nourished female in no acute distress.  Cardiovascular: normal rate and rhythm Respiratory: normal effort, clear to auscultation bilaterally GI: Abd soft, tender over RUQ, gravid appropriate for gestational age.   No rebound or  guarding. MS: Extremities nontender, no edema, normal ROM Neurologic: Alert and oriented x 4.  GU: Neg CVAT.  PELVIC EXAM: deferred   FHT:  Baseline 140 , moderate variability, accelerations present, no decelerations Contractions: Irregular  Rare   Labs: Results for orders placed or performed during the hospital encounter of 08/26/18 (from the past 24 hour(s))  Urinalysis, Routine w reflex microscopic     Status: Abnormal   Collection Time: 08/26/18  2:18 PM  Result Value Ref Range   Color, Urine YELLOW YELLOW   APPearance HAZY (A) CLEAR   Specific Gravity, Urine 1.021 1.005 - 1.030   pH 6.0 5.0 - 8.0   Glucose, UA NEGATIVE NEGATIVE mg/dL   Hgb urine dipstick NEGATIVE NEGATIVE   Bilirubin Urine NEGATIVE NEGATIVE   Ketones, ur 80 (A) NEGATIVE mg/dL   Protein, ur NEGATIVE NEGATIVE mg/dL   Nitrite NEGATIVE NEGATIVE   Leukocytes, UA NEGATIVE NEGATIVE  CBC     Status: Abnormal   Collection Time: 08/26/18  2:19 PM  Result Value Ref Range   WBC 20.2 (H) 4.0 - 10.5 K/uL   RBC 3.70 (L) 3.87 - 5.11 MIL/uL   Hemoglobin 11.1 (L) 12.0 - 15.0 g/dL   HCT 33.7 (L) 36.0 - 46.0 %   MCV 91.1 80.0 - 100.0 fL   MCH 30.0 26.0 - 34.0 pg   MCHC 32.9 30.0 - 36.0 g/dL   RDW 13.8 11.5 - 15.5 %   Platelets 278 150 - 400 K/uL   nRBC 0.0 0.0 - 0.2 %  Comprehensive metabolic panel     Status: Abnormal   Collection Time: 08/26/18  2:19 PM  Result Value Ref Range   Sodium 137 135 - 145 mmol/L   Potassium 4.3 3.5 - 5.1 mmol/L   Chloride 105 98 - 111 mmol/L   CO2 22 22 - 32 mmol/L   Glucose, Bld 98 70 - 99 mg/dL   BUN 7 6 - 20 mg/dL   Creatinine, Ser 0.63 0.44 - 1.00 mg/dL   Calcium 8.9 8.9 - 10.3 mg/dL   Total Protein 7.0 6.5 - 8.1 g/dL   Albumin 3.2 (L) 3.5 - 5.0 g/dL   AST 20 15 - 41 U/L   ALT 14 0 - 44 U/L   Alkaline Phosphatase 56 38 - 126 U/L   Total Bilirubin 0.5 0.3 - 1.2 mg/dL   GFR calc non Af Amer >60 >60 mL/min   GFR calc Af Amer >60 >60 mL/min   Anion gap 10 5 - 15  Sample to  Blood Bank     Status: None   Collection Time: 08/26/18  2:19 PM  Result Value Ref Range   Blood Bank Specimen SAMPLE AVAILABLE FOR TESTING  Sample Expiration      08/29/2018 Performed at [facility], [address] [place], [place] [address]   Lipase, blood     Status: None   Collection Time: 08/26/18  2:19 PM  Result Value Ref Range   Lipase 18 11 - 51 U/L  Amylase     Status: None   Collection Time: 08/26/18  2:19 PM  Result Value Ref Range   Amylase 64 28 - 100 U/L    Imaging:  US Abdomen Limited Ruq  Result Date: 08/26/2018 CLINICAL DATA:  37 year old female with history of gallstones. Abdominal pain. Evaluate for cholecystitis. EXAM: ULTRASOUND ABDOMEN LIMITED RIGHT UPPER QUADRANT COMPARISON:  None. FINDINGS: Gallbladder: In the neck of the gallbladder there is a 1.1 cm echogenic focus with some posterior acoustic shadowing, compatible with a gallstone. Gallbladder does not appear distended. Gallbladder wall is normal in thickness measuring 3 mm. No pericholecystic fluid. Per report from the sonographer, there is no sonographic Murphy's sign on examination. Common bile duct: Diameter: 5.6 mm Liver: No focal lesion identified. Within normal limits in parenchymal echogenicity. Portal vein is patent on color Doppler imaging with normal direction of blood flow towards the liver. IMPRESSION: 1. Cholelithiasis without evidence of acute cholecystitis at this time. Electronically Signed   By: Vinnie Langton M.D.   On: 08/26/2018 16:36    MAU Course/MDM: I have ordered labs and reviewed results. There is leukocytosis but LFTs are normal as are Lipase/Amylase NST reviewed, reassuring for GA Consult Dr Elonda Husky with presentation, exam findings and test results. He recommends phenergan, percocet and followup in office tomorrow for surgical re-consult Treatments in MAU included analgesia.    Assessment: 1. RUQ pain   2.    Intrauterine pregnancy at 92w2dCalculus of gallbladder and  bile duct without cholecystitis or obstruction - Plan: Discharge patient  RUQ pain - Plan: [place] RUQ, [place] LIMITED RUQ, Discharge patient  Plan: Discharge home Reinforced importance of low fat/no fat diet Rx Percocet for pain Rx Phenergan to reduce spasm of gallbladder Strict return precautions,  Discussed if she develops acute cholecystitis, she will likely need cholecystectomy Preterm Labor precautions and fetal kick counts Follow up in Office for prenatal visits and recheck of sttus, Call tomorrow  Encouraged to return here or to other Urgent Care/ED if she develops worsening of symptoms, increase in pain, fever, or other concerning symptoms.   Pt stable at time of discharge.  MHansel FeinsteinCNM, MSN Certified Nurse-Midwife 08/26/2018 2:10 PM

## 2018-08-26 NOTE — Discharge Instructions (Signed)
Biliary Colic, Adult Biliary colic is severe pain caused by a problem with a small organ in the upper right part of your belly (gallbladder). The gallbladder stores a digestive fluid produced in the liver (bile) that helps the body break down fat. Bile and other digestive enzymes are carried from the liver to the small intestine though tube-like structures (bile ducts). The gallbladder and the bile ducts form the biliary tract. Sometimes hard deposits of digestive fluids form in the gallbladder (gallstones) and block the flow of bile from the gallbladder, causing biliary colic. This condition is also called a gallbladder attack. Gallstones can be as small as a grain of sand or as big as a golf ball. There could be just one gallstone in the gallbladder, or there could be many. What are the causes? Biliary colic is usually caused by gallstones. Less often, a tumor could block the flow of bile from the gallbladder and trigger biliary colic. What increases the risk? This condition is more likely to develop in:  Women.  People of Hispanic descent.  People with a family history of gallstones.  People who are obese.  People who suddenly or quickly lose weight.  People who eat a high-calorie, low-fiber diet that is rich in refined carbs (carbohydrates), such as white bread and white rice.  People who have an intestinal disease that affects nutrient absorption, such as Crohn disease.  People who have a metabolic condition, such as metabolic syndrome or diabetes.  What are the signs or symptoms? Severe pain in the upper right side of the belly is the main symptom of biliary colic. You may feel this pain below the chest but above the hip. This pain often occurs at night or after eating a very fatty meal. This pain may get worse for up to an hour and last as long as 12 hours. In most cases, the pain fades (subsides) within a couple hours. Other symptoms of this condition include:  Nausea and  vomiting.  Pain under the right shoulder.  How is this diagnosed? This condition is diagnosed based on your medical history, your symptoms, and a physical exam. You may have tests, including:  Blood tests to rule out infection or inflammation of the bile ducts, gallbladder, pancreas, or liver.  Imaging studies such as: ? Ultrasound. ? CT scan. ? MRI.  In some cases, you may need to have an imaging study done using a small amount of radioactive material (nuclear medicine) to confirm the diagnosis. How is this treated? Treatment for this condition may include medicine to relieve your pain or nausea. If you have gallstones that are causing biliary colic, you may need surgery to remove the gallbladder (cholecystectomy). Gallstones can also be dissolved gradually with medicine. It may take months or years before the gallstones are completely gone. Follow these instructions at home:  Take over-the-counter and prescription medicines only as told by your health care provider.  Drink enough fluid to keep your urine clear or pale yellow.  Follow instructions from your health care provider about eating or drinking restrictions. These may include avoiding: ? Fatty, greasy, and fried foods. ? Any foods that make the pain worse. ? Overeating. ? Having a large meal after not eating for a while.  Keep all follow-up visits as told by your health care provider. This is important. How is this prevented? Steps to prevent this condition include:  Maintaining a healthy body weight.  Getting regular exercise.  Eating a healthy, high-fiber, low-fat diet.  Limiting  how much sugar and refined carbs you eat, such as sweets, white flour, and white rice.  Contact a health care provider if:  Your pain lasts more than 5 hours.  You vomit.  You have a fever and chills.  Your pain gets worse. Get help right away if:  Your skin or the whites of your eyes look yellow (jaundice).  Your have  tea-colored urine and light-colored stools.  You are dizzy or you faint. This information is not intended to replace advice given to you by your health care provider. Make sure you discuss any questions you have with your health care provider. Document Released: 02/20/2006 Document Revised: 05/17/2016 Document Reviewed: 04/04/2016 Elsevier Interactive Patient Education  [employer].

## 2018-08-26 NOTE — MAU Note (Signed)
Has gallstones and had an attack last night, states it usually goes away but the pain is still there. Unable to eat

## 2018-10-03 NOTE — L&D Delivery Note (Signed)
Operative Delivery Note Pt progressed to complete rapidly. Started to push at +2 and brought head down well, head delivery with 5-6 contractions. Shoulders were high. Allowed head to restitute, nuchal cord released over head. Shoulder didn't follow, shoulder dystocia called and RN got extra help. NICU paged.  At 4:39 PM a viable and healthy female was delivered via Vaginal, Spontaneous.  Presentation: vertex; Position: Right,, Occiput,, Anterior; Station: +5. Delivery of the head: spontaneous but before 4.38 pm and baby delivery completed after 1 min 30 sec (computer rounds up times). First maneuver: 12/04/2018  4:38 PM, McRoberts Second maneuver: 12/04/2018  4:38 PM, Suprapubic Pressure - didn't budge left /anterior shoulder.  Wood's screw didn't move anterior shoulder Third maneuver: 12/04/2018  4:39 PM,  Posterior arm was reached and posterior shoulder was brought down, posterior arm was not delivered out as that shoulder came into vaginal introitus and now anterior (LEFT) shoulder dislodged and moved down.   Cord clamped and cut and floppy baby handed to NICU.   APGAR: 2, 9; weight 8 lb 12.4 oz (3980 g).   Placenta status: spontaneous, complete  Cord:  with the following complications: loose nuchal cord.  Cord pH: (a) 7.24 with slightly high bicarb. 23.5  Anesthesia: Epidural Episiotomy:  Not needed, spacious introitus  Lacerations:  Very superficial internal labial on right Est. Blood Loss (mL):  200 cc   Mom to postpartum.  Baby to Couplet care / Skin to Skin. Left arm sluggish but fingers/ hand moving. Peds to continue to assess, per NICU no clinical evidence of fracture. Facial bruising noted.   Shoulder dystocia and baby's findings d/w parents and will continue to watch for Erb's palsy, but at this point it seems more of nerve sprain and should recover completely. Parents had no further questions.   Elveria Royals 12/04/2018, 5:03 PM

## 2018-11-02 LAB — OB RESULTS CONSOLE GBS: GBS: NEGATIVE

## 2018-11-20 ENCOUNTER — Other Ambulatory Visit: Payer: Self-pay | Admitting: Obstetrics & Gynecology

## 2018-11-21 ENCOUNTER — Telehealth (HOSPITAL_COMMUNITY): Payer: Self-pay | Admitting: *Deleted

## 2018-11-21 NOTE — Telephone Encounter (Signed)
Preadmission screen  

## 2018-11-30 ENCOUNTER — Other Ambulatory Visit: Payer: Self-pay | Admitting: Obstetrics & Gynecology

## 2018-12-04 ENCOUNTER — Inpatient Hospital Stay (HOSPITAL_COMMUNITY): Payer: BLUE CROSS/BLUE SHIELD | Admitting: Anesthesiology

## 2018-12-04 ENCOUNTER — Inpatient Hospital Stay (HOSPITAL_COMMUNITY)
Admission: RE | Admit: 2018-12-04 | Discharge: 2018-12-04 | Disposition: A | Discharge status: Home / Self Care | Payer: BLUE CROSS/BLUE SHIELD | Source: Ambulatory Visit | Attending: Obstetrics & Gynecology | Admitting: Obstetrics & Gynecology

## 2018-12-04 ENCOUNTER — Encounter (HOSPITAL_COMMUNITY): Payer: Self-pay

## 2018-12-04 ENCOUNTER — Inpatient Hospital Stay (HOSPITAL_COMMUNITY)
Admission: AD | Admit: 2018-12-04 | Discharge: 2018-12-05 | DRG: 387 | Disposition: A | Discharge status: Home / Self Care | Payer: BLUE CROSS/BLUE SHIELD | Attending: Obstetrics & Gynecology | Admitting: Obstetrics & Gynecology

## 2018-12-04 DIAGNOSIS — Z87891 Personal history of nicotine dependence: Secondary | ICD-10-CM | POA: Diagnosis not present

## 2018-12-04 DIAGNOSIS — O9902 Anemia complicating childbirth: Secondary | ICD-10-CM | POA: Diagnosis present

## 2018-12-04 DIAGNOSIS — O9962 Diseases of the digestive system complicating childbirth: Secondary | ICD-10-CM | POA: Diagnosis present

## 2018-12-04 DIAGNOSIS — O99214 Obesity complicating childbirth: Secondary | ICD-10-CM | POA: Diagnosis present

## 2018-12-04 DIAGNOSIS — K8020 Calculus of gallbladder without cholecystitis without obstruction: Secondary | ICD-10-CM | POA: Diagnosis present

## 2018-12-04 DIAGNOSIS — Z3490 Encounter for supervision of normal pregnancy, unspecified, unspecified trimester: Secondary | ICD-10-CM

## 2018-12-04 DIAGNOSIS — D649 Anemia, unspecified: Secondary | ICD-10-CM | POA: Diagnosis present

## 2018-12-04 DIAGNOSIS — Z3A39 39 weeks gestation of pregnancy: Secondary | ICD-10-CM | POA: Diagnosis not present

## 2018-12-04 DIAGNOSIS — K5090 Crohn's disease, unspecified, without complications: Secondary | ICD-10-CM | POA: Diagnosis present

## 2018-12-04 DIAGNOSIS — O26893 Other specified pregnancy related conditions, third trimester: Secondary | ICD-10-CM | POA: Diagnosis present

## 2018-12-04 HISTORY — DX: Calculus of gallbladder without cholecystitis without obstruction: K80.20

## 2018-12-04 LAB — TYPE AND SCREEN
ABO/RH(D): O POS
Antibody Screen: NEGATIVE

## 2018-12-04 LAB — CBC
HEMATOCRIT: 34.3 % — AB (ref 36.0–46.0)
Hemoglobin: 10.9 g/dL — ABNORMAL LOW (ref 12.0–15.0)
MCH: 28.2 pg (ref 26.0–34.0)
MCHC: 31.8 g/dL (ref 30.0–36.0)
MCV: 88.6 fL (ref 80.0–100.0)
Platelets: 194 10*3/uL (ref 150–400)
RBC: 3.87 MIL/uL (ref 3.87–5.11)
RDW: 14.4 % (ref 11.5–15.5)
WBC: 12 10*3/uL — ABNORMAL HIGH (ref 4.0–10.5)
nRBC: 0 % (ref 0.0–0.2)

## 2018-12-04 LAB — ABO/RH: ABO/RH(D): O POS

## 2018-12-04 LAB — RPR: RPR Ser Ql: NONREACTIVE

## 2018-12-04 MED ORDER — DIBUCAINE 1 % RE OINT: 1.0000 "application " | TOPICAL_OINTMENT | RECTAL | Status: DC | PRN

## 2018-12-04 MED ORDER — SENNOSIDES-DOCUSATE SODIUM 8.6-50 MG PO TABS
2.0000 | ORAL_TABLET | ORAL | Status: DC
  Administered 2018-12-04: 2 via ORAL
  Filled 2018-12-04: qty 2

## 2018-12-04 MED ORDER — ZOLPIDEM TARTRATE 5 MG PO TABS: 5.0000 mg | ORAL_TABLET | Freq: Every evening | ORAL | Status: DC | PRN

## 2018-12-04 MED ORDER — WITCH HAZEL-GLYCERIN EX PADS: 1.0000 "application " | MEDICATED_PAD | CUTANEOUS | Status: DC | PRN

## 2018-12-04 MED ORDER — PHENYLEPHRINE 40 MCG/ML (10ML) SYRINGE FOR IV PUSH (FOR BLOOD PRESSURE SUPPORT): 80.0000 ug | PREFILLED_SYRINGE | INTRAVENOUS | Status: DC | PRN

## 2018-12-04 MED ORDER — LACTATED RINGERS IV SOLN
500.0000 mL | Freq: Once | INTRAVENOUS | Status: AC
  Administered 2018-12-04: 500 mL via INTRAVENOUS

## 2018-12-04 MED ORDER — PRENATAL MULTIVITAMIN CH
1.0000 | ORAL_TABLET | Freq: Every day | ORAL | Status: DC
  Administered 2018-12-05: 1 via ORAL
  Filled 2018-12-04: qty 1

## 2018-12-04 MED ORDER — IBUPROFEN 600 MG PO TABS
600.0000 mg | ORAL_TABLET | Freq: Four times a day (QID) | ORAL | Status: DC
  Administered 2018-12-04 – 2018-12-05 (×3): 600 mg via ORAL
  Filled 2018-12-04 (×3): qty 1

## 2018-12-04 MED ORDER — ACETAMINOPHEN 325 MG PO TABS: 650.0000 mg | ORAL_TABLET | ORAL | Status: DC | PRN

## 2018-12-04 MED ORDER — EPHEDRINE 5 MG/ML INJ: 10.0000 mg | INTRAVENOUS | Status: DC | PRN

## 2018-12-04 MED ORDER — COCONUT OIL OIL: 1.0000 "application " | TOPICAL_OIL | Status: DC | PRN

## 2018-12-04 MED ORDER — DIPHENHYDRAMINE HCL 50 MG/ML IJ SOLN: 12.5000 mg | INTRAMUSCULAR | Status: DC | PRN

## 2018-12-04 MED ORDER — OXYTOCIN BOLUS FROM INFUSION
500.0000 mL | Freq: Once | INTRAVENOUS | Status: AC
  Administered 2018-12-04: 500 mL via INTRAVENOUS

## 2018-12-04 MED ORDER — LACTATED RINGERS IV SOLN
INTRAVENOUS | Status: DC
  Administered 2018-12-04 (×2): via INTRAVENOUS

## 2018-12-04 MED ORDER — FENTANYL-BUPIVACAINE-NACL 0.5-0.125-0.9 MG/250ML-% EP SOLN
12.0000 mL/h | EPIDURAL | Status: DC | PRN
  Filled 2018-12-04: qty 250

## 2018-12-04 MED ORDER — SOD CITRATE-CITRIC ACID 500-334 MG/5ML PO SOLN: 30.0000 mL | ORAL | Status: DC | PRN

## 2018-12-04 MED ORDER — SODIUM CHLORIDE (PF) 0.9 % IJ SOLN
INTRAMUSCULAR | Status: DC | PRN
  Administered 2018-12-04: 12 mL/h via EPIDURAL

## 2018-12-04 MED ORDER — ONDANSETRON HCL 4 MG/2ML IJ SOLN: 4.0000 mg | Freq: Four times a day (QID) | INTRAMUSCULAR | Status: DC | PRN

## 2018-12-04 MED ORDER — LIDOCAINE HCL (PF) 1 % IJ SOLN: 30.0000 mL | INTRAMUSCULAR | Status: DC | PRN

## 2018-12-04 MED ORDER — ONDANSETRON HCL 4 MG/2ML IJ SOLN: 4.0000 mg | INTRAMUSCULAR | Status: DC | PRN

## 2018-12-04 MED ORDER — SIMETHICONE 80 MG PO CHEW: 80.0000 mg | CHEWABLE_TABLET | ORAL | Status: DC | PRN

## 2018-12-04 MED ORDER — LIDOCAINE HCL (PF) 1 % IJ SOLN
INTRAMUSCULAR | Status: DC | PRN
  Administered 2018-12-04 (×2): 4 mL via EPIDURAL

## 2018-12-04 MED ORDER — OXYTOCIN 40 UNITS IN NORMAL SALINE INFUSION - SIMPLE MED: 2.5000 [IU]/h | INTRAVENOUS | Status: DC

## 2018-12-04 MED ORDER — ONDANSETRON HCL 4 MG PO TABS: 4.0000 mg | ORAL_TABLET | ORAL | Status: DC | PRN

## 2018-12-04 MED ORDER — TERBUTALINE SULFATE 1 MG/ML IJ SOLN: 0.2500 mg | Freq: Once | INTRAMUSCULAR | Status: DC | PRN

## 2018-12-04 MED ORDER — TETANUS-DIPHTH-ACELL PERTUSSIS 5-2.5-18.5 LF-MCG/0.5 IM SUSP: 0.5000 mL | Freq: Once | INTRAMUSCULAR | Status: DC

## 2018-12-04 MED ORDER — OXYTOCIN 40 UNITS IN NORMAL SALINE INFUSION - SIMPLE MED
1.0000 m[IU]/min | INTRAVENOUS | Status: DC
  Administered 2018-12-04: 4 m[IU]/min via INTRAVENOUS
  Filled 2018-12-04: qty 1000

## 2018-12-04 MED ORDER — DIPHENHYDRAMINE HCL 25 MG PO CAPS: 25.0000 mg | ORAL_CAPSULE | Freq: Four times a day (QID) | ORAL | Status: DC | PRN

## 2018-12-04 MED ORDER — BENZOCAINE-MENTHOL 20-0.5 % EX AERO
1.0000 "application " | INHALATION_SPRAY | CUTANEOUS | Status: DC | PRN
  Filled 2018-12-04: qty 56

## 2018-12-04 MED ORDER — LACTATED RINGERS IV SOLN: 500.0000 mL | INTRAVENOUS | Status: DC | PRN

## 2018-12-04 MED ORDER — ACETAMINOPHEN 325 MG PO TABS
650.0000 mg | ORAL_TABLET | ORAL | Status: DC | PRN
  Administered 2018-12-05: 650 mg via ORAL
  Filled 2018-12-04 (×2): qty 2

## 2018-12-04 MED ORDER — LACTATED RINGERS IV SOLN: 500.0000 mL | Freq: Once | INTRAVENOUS | Status: DC

## 2018-12-04 MED ORDER — PHENYLEPHRINE 40 MCG/ML (10ML) SYRINGE FOR IV PUSH (FOR BLOOD PRESSURE SUPPORT)
80.0000 ug | PREFILLED_SYRINGE | INTRAVENOUS | Status: DC | PRN
  Filled 2018-12-04: qty 10

## 2018-12-04 NOTE — Anesthesia Pain Management Evaluation Note (Signed)
  [facility] Pain Management Visit Note  Patient: Marisa Sharp, 38 y.o., female  "Hello I am a member of the anesthesia team at [facility]. We have an anesthesia team available at all times to provide care throughout the hospital, including epidural management and anesthesia for C-section. I don't know your plan for the delivery whether it a natural birth, water birth, IV sedation, nitrous supplementation, doula or epidural, but we want to meet your pain goals."   1.Was your pain managed to your expectations on prior hospitalizations?   Yes   2.What is your expectation for pain management during this hospitalization?     Epidural  3.How can we help you reach that goal?   Record the patient's initial score and the patient's pain goal.   Pain: 3  Pain Goal: 5 [facility] wants you to be able to say your pain was always managed very well.  Jabier Mutton 12/04/2018

## 2018-12-04 NOTE — Progress Notes (Signed)
Marisa Sharp is a 38 y.o. G2P1001 at 72w4dby ultrasound admitted for induction of labor due to Elective at term and AMA.  Subjective: No pain  Objective: BP 115/78   Pulse 83   Temp 99 F (37.2 C) (Oral)   Resp 17   LMP 01/30/2018  No intake/output data recorded. No intake/output data recorded.  FHT:  FHR: 120s bpm, variability: moderate,  accelerations:  Present,  decelerations:  Absent UC:   regular, every 3-4 minutes SVE:   Dilation: 4 Effacement (%): 70 Station: -2 Exam by:: Marisa Birky md AROM, thin mec stained fluid  Labs: Lab Results  Component Value Date   WBC 12.0 (H) 12/04/2018   HGB 10.9 (L) 12/04/2018   HCT 34.3 (L) 12/04/2018   MCV 88.6 12/04/2018   PLT 194 12/04/2018    Assessment / Plan: Induction of labor due to term with favorable cervix,  progressing well on pitocin  Labor: Progressing normally, latent labor  Fetal Wellbeing:  Category I Pain Control:  Epidural per pt's request  I/D:  n/a Anticipated MOD:  NSVD  Marisa Royals3/12/2018, 2:31 PM

## 2018-12-04 NOTE — Anesthesia Preprocedure Evaluation (Signed)
Anesthesia Evaluation  Patient identified by MRN, date of birth, ID band Patient awake    Reviewed: Allergy & Precautions, Patient's Chart, lab work & pertinent test results  Airway Mallampati: II  TM Distance: >3 FB Neck ROM: Full    Dental no notable dental hx. (+) Teeth Intact   Pulmonary former smoker,    Pulmonary exam normal breath sounds clear to auscultation       Cardiovascular negative cardio ROS Normal cardiovascular exam Rhythm:Regular Rate:Normal     Neuro/Psych PSYCHIATRIC DISORDERS Anxiety Hx/o Panic attacksnegative neurological ROS     GI/Hepatic Neg liver ROS, GERD  ,  Endo/Other  Obesity  Renal/GU negative Renal ROS  negative genitourinary   Musculoskeletal negative musculoskeletal ROS (+)   Abdominal (+) + obese,   Peds  Hematology  (+) anemia ,   Anesthesia Other Findings   Reproductive/Obstetrics (+) Pregnancy                             Anesthesia Physical Anesthesia Plan  ASA: II  Anesthesia Plan: Epidural   Post-op Pain Management:    Induction:   PONV Risk Score and Plan:   Airway Management Planned: Natural Airway  Additional Equipment:   Intra-op Plan:   Post-operative Plan:   Informed Consent: I have reviewed the patients History and Physical, chart, labs and discussed the procedure including the risks, benefits and alternatives for the proposed anesthesia with the patient or authorized representative who has indicated his/her understanding and acceptance.       Plan Discussed with: Anesthesiologist  Anesthesia Plan Comments:         Anesthesia Quick Evaluation

## 2018-12-04 NOTE — Anesthesia Procedure Notes (Signed)
Epidural Patient location during procedure: OB Start time: 12/04/2018 2:59 PM End time: 12/04/2018 3:08 PM  Staffing Anesthesiologist: Josephine Igo, MD Performed: anesthesiologist   Preanesthetic Checklist Completed: patient identified, site marked, surgical consent, pre-op evaluation, timeout performed, IV checked, risks and benefits discussed and monitors and equipment checked  Epidural Patient position: sitting Prep: site prepped and draped and DuraPrep Patient monitoring: continuous pulse ox and blood pressure Approach: midline Location: L3-L4 Injection technique: LOR air  Needle:  Needle type: Tuohy  Needle gauge: 17 G Needle length: 9 cm and 9 Needle insertion depth: 5 cm cm Catheter type: closed end flexible Catheter size: 19 Gauge Catheter at skin depth: 10 cm Test dose: negative and Other  Assessment Events: blood not aspirated, injection not painful, no injection resistance, negative IV test and no paresthesia  Additional Notes Patient identified. Risks and benefits discussed including failed block, incomplete  Pain control, post dural puncture headache, nerve damage, paralysis, blood pressure Changes, nausea, vomiting, reactions to medications-both toxic and allergic and post Partum back pain. All questions were answered. Patient expressed understanding and wished to proceed. Sterile technique was used throughout procedure. Epidural site was Dressed with sterile barrier dressing. No paresthesias, signs of intravascular injection Or signs of intrathecal spread were encountered.  Patient was more comfortable after the epidural was dosed. Please see RN's note for documentation of vital signs and FHR which are stable. Reason for block:procedure for pain

## 2018-12-04 NOTE — Anesthesia Postprocedure Evaluation (Signed)
Anesthesia Post Note  Patient: Kaysie Michelini  Procedure(s) Performed: AN AD Wallace     Patient location during evaluation: Mother Baby Anesthesia Type: Epidural Level of consciousness: awake, awake and alert and oriented Pain management: pain level controlled Vital Signs Assessment: post-procedure vital signs reviewed and stable Respiratory status: spontaneous breathing, nonlabored ventilation and respiratory function stable Cardiovascular status: stable Postop Assessment: no headache, no backache, patient able to bend at knees, no apparent nausea or vomiting, adequate PO intake and able to ambulate Anesthetic complications: no    Last Vitals:  Vitals:   12/04/18 1836 12/04/18 2004  BP: 110/74 127/68  Pulse: 86 87  Resp: 18 18  Temp: 36.5 C 36.8 C  SpO2:      Last Pain:  Vitals:   12/04/18 2004  TempSrc: Oral  PainSc: 0-No pain   Pain Goal:                   Dorathy Stallone

## 2018-12-04 NOTE — Lactation Note (Signed)
This note was copied from a baby's chart. Lactation Consultation Note  Patient Name: Marisa Sharp [id number] Date: 12/04/2018 Reason for consult: Initial assessment  Initial visit at 3 hours of life with this P2 who nursed her 1st child for 6 months.   Mom reports she has been leaking for at least the last few weeks. Mom showed me that her nipples are flat, but she has been using a hand pump to help evert them prior to latch. Mom also brought her own shells from home. Mom says that her nipples will begin everting on their own once this infant begins latching more often & will not present a problem.  There was a dystocia at birth, but infant has fed once since then. Mom says that infant fed well & she was comfortable with latch.  Mom was made aware of O/P services & phone # for post-discharge questions. Family moved her from [place] in 2016.    [name] [facility] 12/04/2018, 8:17 PM

## 2018-12-04 NOTE — Lactation Note (Signed)
This note was copied from a baby's chart. Lactation Consultation Note  Patient Name: Marisa Sharp [id number] Date: 12/04/2018 Reason for consult: Initial assessment  Parents had requested assistance. RN had provided a nipple shield (size 24) for flat nipples. With the teacup hold, infant was able to latch to the bare breast & transfer colostrum (swallows verified by cervical auscultation). Dad was shown how to use the teacup hold. Mom expects that her nipples will start to evert more tomorrow and latching will then be easier. Parents understand that the nipple shield is a tool that may be helpful if infant is too fussy to latch to the bare breast.   Mom's breasts feel heavy & I anticipate her milk coming to volume soon.   Marisa Sharp [facility] 12/04/2018, 10:21 PM

## 2018-12-04 NOTE — H&P (Addendum)
Marisa Sharp is a 38 y.o. female presenting for labor IOL at 60.4  Wks, [facility] 12/07/18 by 1st trim sono. AMA, favorable cervix, prior SVD. +FMs. No LOF/VB. Some UCs. Fears pain, ok for early epidural AMA, but uncomplicated PNCare. Starting at 10 wks. Nl NIPS. Prior SVD 9 lbs at 41 wks.  Several gall stone related pain episodes earlier on in preg, but diet changes helped. Saw a Psychologist, sport and exercise in preg for consult.  Stable Crohn's disease Anxiety/ depression hx. No meds. H/o PP Depression.  H/o hypothyroidism in 1st preg, nl since.   OB History    Gravida  2   Para  1   Term  1   Preterm      AB      Living  1     SAB      TAB      Ectopic      Multiple      Live Births             Past Medical History:  Diagnosis Date  . Crohn's disease ([place]) 01/2016  . Gallstones 05/2018  . Low vitamin D level 2017  . Panic disorder    Past Surgical History:  Procedure Laterality Date  . COSMETIC SURGERY     bilateral ear surgery   Family History: family history includes Cancer (age of onset: 66) in her father; Diabetes in her father and paternal grandmother; Hypertension in her father; Seizures in her father; Stroke in her father; Thyroid disease in her sister. Social History:  reports that she quit smoking about 2 years ago. Her smoking use included cigarettes. She has never used smokeless tobacco. She reports current alcohol use of about 2.0 standard drinks of alcohol per week. She reports that she does not use drugs.     Maternal Diabetes: No Genetic Screening: Normal NIPS and Nl NT scan, nl AFP1 Maternal Ultrasounds/Referrals: Normal Fetal Ultrasounds or other Referrals:  None Maternal Substance Abuse:  No Significant Maternal Medications:  Meds include: Other: Pepcid Significant Maternal Lab Results:  Lab values include: Group B Strep negative Other Comments:  None  ROS History Dilation: 3 Effacement (%): 70 Station: -2 Exam by:: h stone rnc Blood pressure (!) 131/91,  pulse 92, temperature 97.7 F (36.5 C), temperature source Oral, resp. rate 18, last menstrual period 01/30/2018. Exam Physical Exam  Physical exam:  A&O x 3, no acute distress. Pleasant HEENT neg, no thyromegaly Lungs CTA bilat CV RRR, S1S2 normal Abdo soft, non tender, non acute Extr no edema/ tenderness Pelvic per RN above FHT  120s + accels no decels mod variab- cat I Toco irreg   Prenatal labs: ABO, Rh: O/Positive/-- (08/09 0000) Antibody: Negative (08/09 0000) Rubella: Immune (08/09 0000) RPR: Nonreactive (08/09 0000)  HBsAg: Negative (08/09 0000)  HIV: Non-reactive (08/09 0000)  GBS: Negative (01/31 0000)   Assessment/Plan: 38 yo G2P1001, 39.4 wks, IOL for favorable cx with AMA. Pitocin per protocol.  EFW 8.1/2 lbs. (Prior 9 lb SVD) Epidural per pt's request Anticipate SVD   Elveria Royals 12/04/2018, 8:02 AM

## 2018-12-05 LAB — CBC
HCT: 30.7 % — ABNORMAL LOW (ref 36.0–46.0)
Hemoglobin: 10 g/dL — ABNORMAL LOW (ref 12.0–15.0)
MCH: 28.7 pg (ref 26.0–34.0)
MCHC: 32.6 g/dL (ref 30.0–36.0)
MCV: 88 fL (ref 80.0–100.0)
NRBC: 0 % (ref 0.0–0.2)
Platelets: 180 10*3/uL (ref 150–400)
RBC: 3.49 MIL/uL — ABNORMAL LOW (ref 3.87–5.11)
RDW: 14.6 % (ref 11.5–15.5)
WBC: 15.5 10*3/uL — ABNORMAL HIGH (ref 4.0–10.5)

## 2018-12-05 MED ORDER — WITCH HAZEL-GLYCERIN EX PADS: 1.0000 "application " | MEDICATED_PAD | CUTANEOUS | 12 refills | Status: AC | PRN

## 2018-12-05 MED ORDER — DIBUCAINE 1 % RE OINT: 1.0000 "application " | TOPICAL_OINTMENT | RECTAL | 0 refills | Status: AC | PRN

## 2018-12-05 MED ORDER — COCONUT OIL OIL: 1.0000 "application " | TOPICAL_OIL | 0 refills | Status: AC | PRN

## 2018-12-05 MED ORDER — IBUPROFEN 600 MG PO TABS: 600.0000 mg | ORAL_TABLET | Freq: Four times a day (QID) | ORAL | 0 refills | Status: AC

## 2018-12-05 MED ORDER — ACETAMINOPHEN 325 MG PO TABS: 650.0000 mg | ORAL_TABLET | Freq: Four times a day (QID) | ORAL | Status: AC | PRN

## 2018-12-05 MED ORDER — BENZOCAINE-MENTHOL 20-0.5 % EX AERO: 1.0000 "application " | INHALATION_SPRAY | CUTANEOUS | Status: AC | PRN

## 2018-12-05 MED ORDER — DIPHENHYDRAMINE HCL 25 MG PO CAPS: 25.0000 mg | ORAL_CAPSULE | Freq: Four times a day (QID) | ORAL | 0 refills | Status: AC | PRN

## 2018-12-05 NOTE — Discharge Summary (Signed)
Obstetric Discharge Summary Reason for Admission: induction of labor 39 wks Prenatal Procedures: ultrasound Intrapartum Procedures: spontaneous vaginal delivery, shoulder dystocia  Postpartum Procedures: none Complications-Operative and Postpartum: none Hemoglobin  Date Value Ref Range Status  12/05/2018 10.0 (L) 12.0 - 15.0 g/dL Final  07/10/2017 13.0 11.1 - 15.9 g/dL Final   HCT  Date Value Ref Range Status  12/05/2018 30.7 (L) 36.0 - 46.0 % Final   Hematocrit  Date Value Ref Range Status  07/10/2017 38.3 34.0 - 46.6 % Final    Physical Exam:  General: alert and cooperative Lochia: appropriate Uterine Fundus: firm Incision: no tears DVT Evaluation: Negative Homan's sign.  Discharge Diagnoses: Term Pregnancy-delivered, Shoulder dystocia   Discharge Information: Date: 12/05/2018 Activity: pelvic rest Diet: routine Medications: PNV, Ibuprofen and Tylenol Condition: stable Instructions: refer to practice specific booklet Discharge to: home  Risk of recurrent shoulder dystocia reviewed in future pregnancies  Follow-up Information    Azucena Fallen, MD Follow up in 6 week(s).   Specialty:  Obstetrics and Gynecology Contact information: [place] [address] [phone]           Newborn Data: Live born female, left arm weakness resolved few hours after birth, no fractures  Birth Weight: 8 lb 12.4 oz (3980 g) APGAR: 2, 9 Cord pH normal   Newborn Delivery   Birth date/time:  12/04/2018 16:39:00 Delivery type:  Vaginal, Spontaneous, shoulder dystocia      Home with mother.  Elveria Royals 12/05/2018, 10:23 AM

## 2018-12-05 NOTE — Progress Notes (Signed)
CSW received consult for hx of panic disorder and PPD.  CSW met with MOB to offer support and complete assessment.    When CSW arrived, MOB was resting in bed engaging in skin to skin with infant. FOB was also present and was watching TV.  CSW explained CSW's role and MOB gave CSW permission to complete the assessment while FOB was present. MOB and FOB were polite and easy to engage.  FOB appeared supportive of MOB and infant.   CSW asked about MOB's MH hx and MOB acknowledged a hx of panic attacks and PPD.  MOB reported that MOB's PPD symptoms last over 2 years until symptoms were managed by medication.  Per MOB, MOB was compliant with medication for 6 months and symptoms subsided.   CSW provided education regarding the baby blues period vs. perinatal mood disorders, discussed treatment and gave resources for mental health follow up if concerns arise.  CSW recommends self-evaluation during the postpartum time period using the New Mom Checklist from Postpartum Progress and encouraged MOB to contact a medical professional if symptoms are noted at any time. MOB reported having a good support team and expressed feeling comfortable seeking help if help is warranted.  CSW offered MOB resources for outpatient counseling and MOB declined.  CSW assessed for safety and MOB denied SI and HI.    CSW identifies no further need for intervention and no barriers to discharge at this time.  Marisa Sharp, MSW, LCSW Clinical Social Work [phone]   

## 2018-12-05 NOTE — Progress Notes (Signed)
Post Partum Day 1, SVD, Shoulder dystocia  GIRL, with mom, moving left arm well.   Subjective: no complaints, up ad lib, voiding, tolerating PO and + flatus  Objective: Blood pressure 118/82, pulse 79, temperature 98.2 F (36.8 C), temperature source Oral, resp. rate 18, height 5' 4"  (1.626 m), weight 82 kg, last menstrual period 01/30/2018, SpO2 100 %, unknown if currently breastfeeding.  Physical Exam:  General: alert and cooperative Lochia: appropriate Uterine Fundus: firm Incision: n/a DVT Evaluation: Negative Homan's sign.  Recent Labs    12/04/18 0730 12/05/18 0514  HGB 10.9* 10.0*  HCT 34.3* 30.7*  Rh pos Rub Imm  Assessment/Plan: Discharge home today if baby okay for discharge, RN will call  PP care reviewed Warning s/s reviewed F/up office 6 wks w Dr Benjie Karvonen   LOS: 1 day   Marisa Sharp 12/05/2018, 10:00 AM

## 2018-12-06 ENCOUNTER — Ambulatory Visit: Payer: Self-pay

## 2018-12-06 NOTE — Lactation Note (Signed)
This note was copied from a baby's chart. Lactation Consultation Note  Patient Name: Marisa Sharp [id number] Date: 12/06/2018 Reason for consult: Follow-up assessment   P2, Baby 21 hours old.  Observed feeding and recommend mother sandwich breast to achieve a deeper latch. Baby breastfed for 5 min and fell asleep.  Suggest placing baby STS on mother's chest in an hours to try for a longer feeding. Mother's nipples are tender.  Stressed the importance of a deep latch.  Encouraged mother to apply coconut oil.  Feed on demand approximately 8-12 times per day.   Reviewed engorgement care and monitoring voids/stools.    Maternal Data    Feeding Feeding Type: Breast Fed  LATCH Score Latch: Repeated attempts needed to sustain latch, nipple held in mouth throughout feeding, stimulation needed to elicit sucking reflex.  Audible Swallowing: A few with stimulation  Type of Nipple: Everted at rest and after stimulation  Comfort (Breast/Nipple): Filling, red/small blisters or bruises, mild/mod discomfort  Hold (Positioning): No assistance needed to correctly position infant at breast.  LATCH Score: 7  Interventions Interventions: Breast feeding basics reviewed;Assisted with latch;Hand express  Lactation Tools Discussed/Used     Consult Status Consult Status: Complete Date: 12/06/18    Vivianne Master [facility] 12/06/2018, 9:57 AM

## 2019-02-19 ENCOUNTER — Ambulatory Visit: Payer: Self-pay | Admitting: General Surgery

## 2019-03-01 ENCOUNTER — Other Ambulatory Visit: Payer: Self-pay | Admitting: General Surgery

## 2019-03-01 DIAGNOSIS — K8020 Calculus of gallbladder without cholecystitis without obstruction: Secondary | ICD-10-CM

## 2019-03-07 ENCOUNTER — Ambulatory Visit
Admission: RE | Admit: 2019-03-07 | Discharge: 2019-03-07 | Disposition: A | Discharge status: Home / Self Care | Payer: BLUE CROSS/BLUE SHIELD | Source: Ambulatory Visit | Attending: General Surgery | Admitting: General Surgery

## 2019-03-07 DIAGNOSIS — K8020 Calculus of gallbladder without cholecystitis without obstruction: Secondary | ICD-10-CM

## 2019-03-07 MED ORDER — IOPAMIDOL (ISOVUE-300) INJECTION 61%
100.0000 mL | Freq: Once | INTRAVENOUS | Status: AC | PRN
  Administered 2019-03-07: 100 mL via INTRAVENOUS

## 2019-11-21 IMAGING — CT CT ABDOMEN AND PELVIS WITH CONTRAST (ENTEROGRAPHY)
2 of 5 series · 15 of 46 positions shown, 17 images · IV contrast (iopamidol)
Comparison: None.

CLINICAL DATA: Crohn's disease. Chronic cholecystitis.

EXAM:
CT ABDOMEN AND PELVIS WITH CONTRAST (ENTEROGRAPHY)
TECHNIQUE: Multidetector CT of the abdomen and pelvis during bolus
administration of intravenous contrast. Negative oral contrast was
given.
CONTRAST:  100mL IIXG33-SRR IOPAMIDOL (IIXG33-SRR) INJECTION 61%

[Series 4: enterography 2.00 br40 s3 cor cor · coronal · 0.67mm/px · 3 of 133 slices shown]
[im 45/133  soft-tissue]
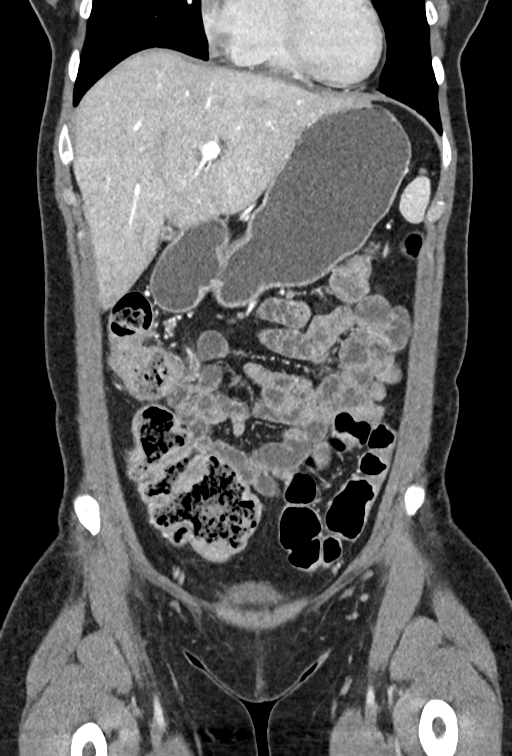
[im 59/133  soft-tissue]
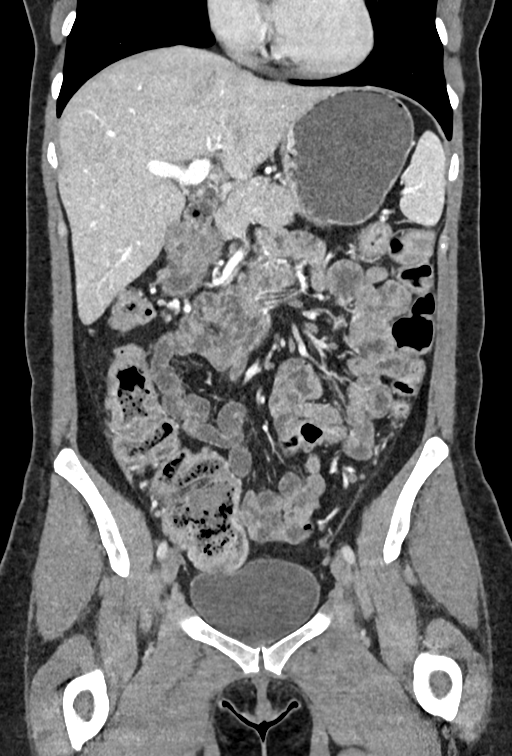
[im 74/133  soft-tissue]
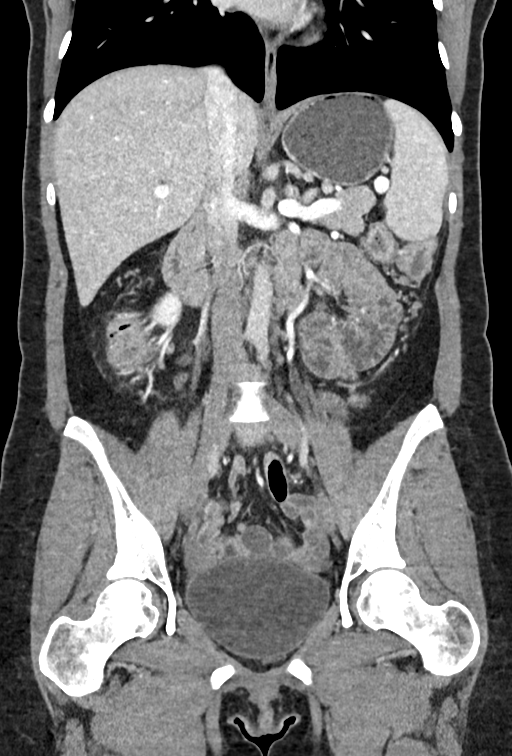

[Series 9: enterography 3.00 br40 s3 axial 3mm · axial · 0.67mm/px · z∈[+1141,+1588]mm · 12 of 169 slices shown, 14 images]
[im 10/169  soft-tissue]
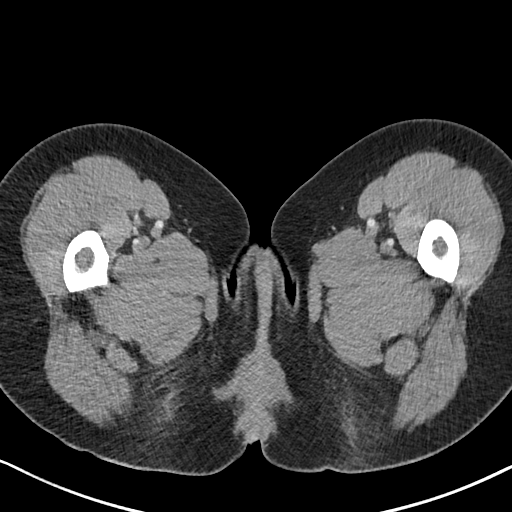
[im 10/169  bone]
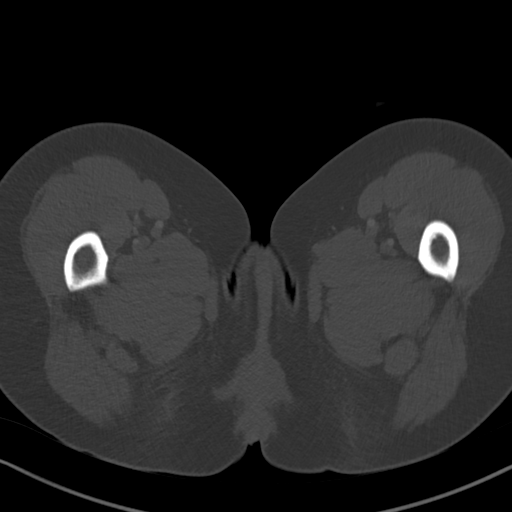
[im 30/169  soft-tissue]
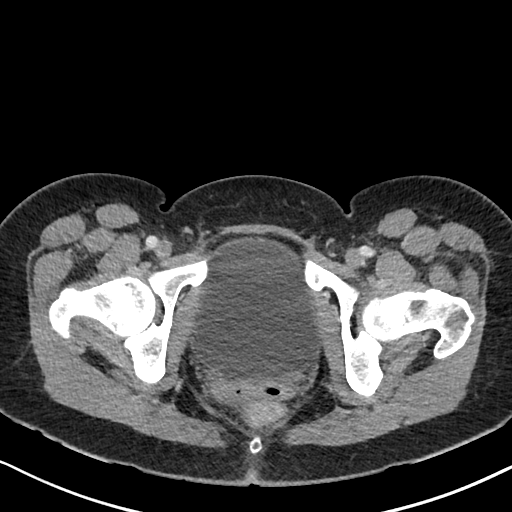
[im 40/169  soft-tissue]
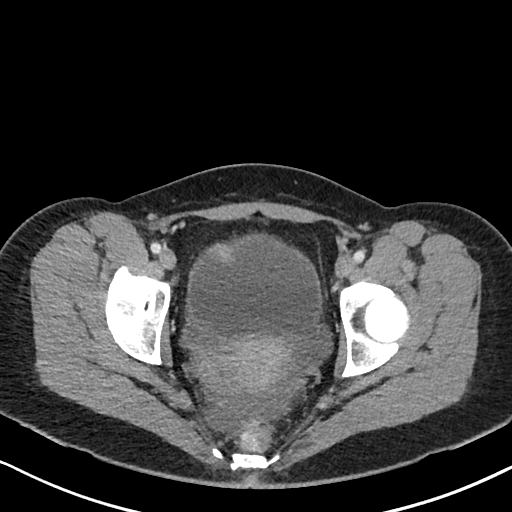
[im 50/169  soft-tissue]
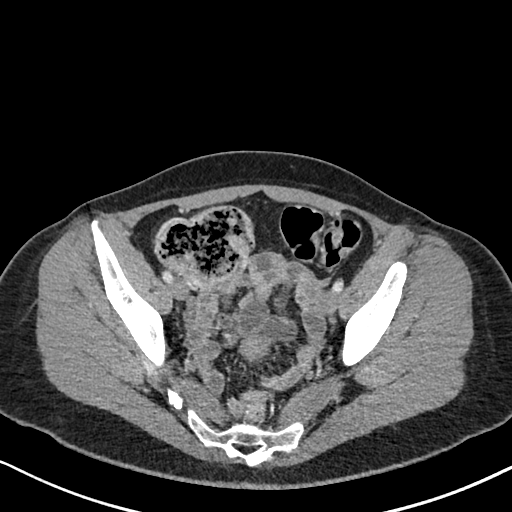
[im 70/169  soft-tissue]
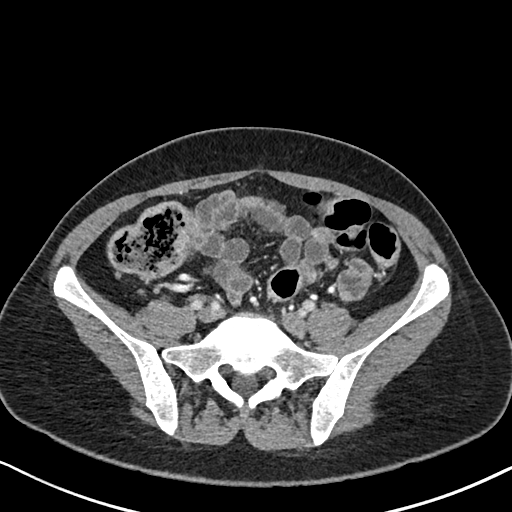
[im 80/169  soft-tissue]
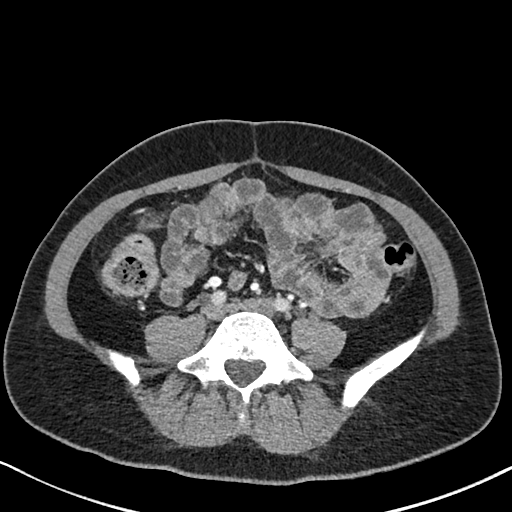
[im 89/169  soft-tissue]
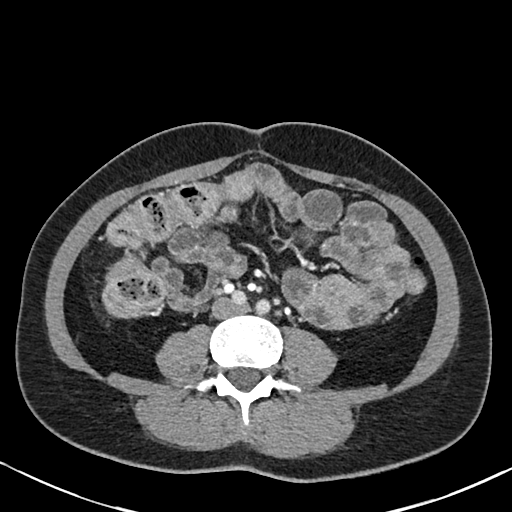
[im 109/169  soft-tissue]
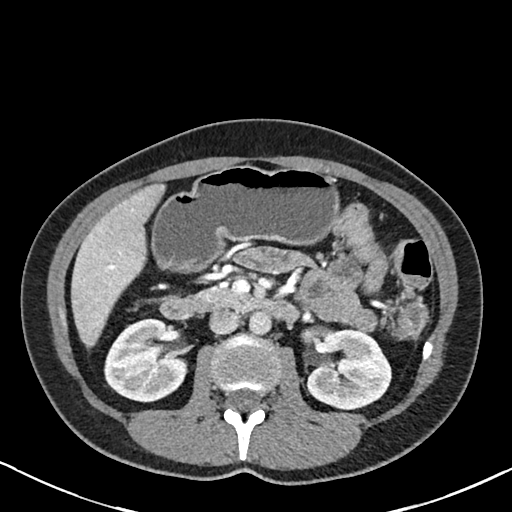
[im 119/169  soft-tissue]
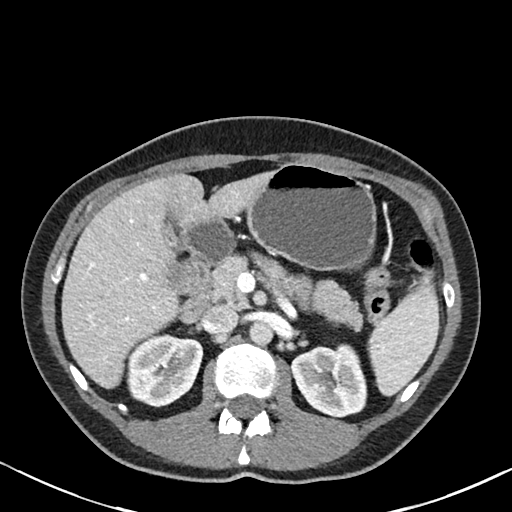
[im 119/169  bone]
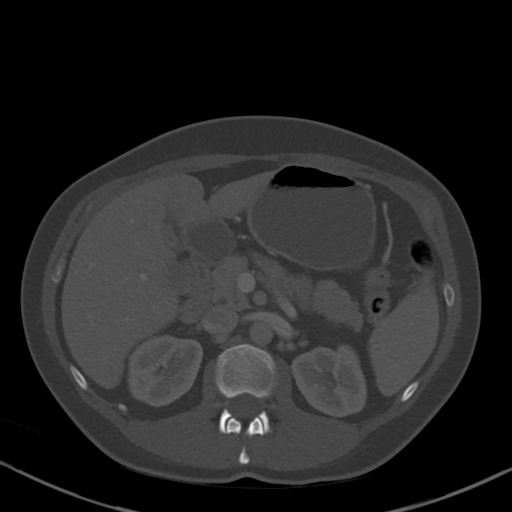
[im 129/169  soft-tissue]
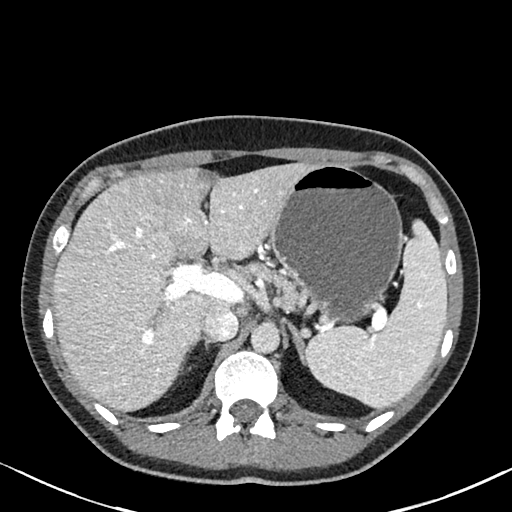
[im 149/169  soft-tissue]
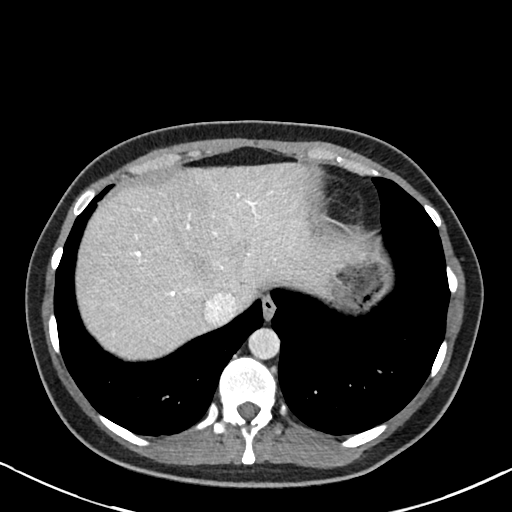
[im 159/169  soft-tissue]
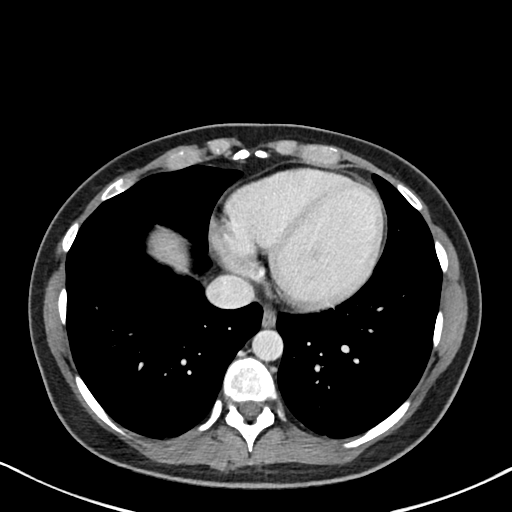

[15 of 46 positions shown; findings below may reference images not displayed]

FINDINGS: Lower Chest: 2 adjacent noncalcified pulmonary nodules are seen
posterior right lower lobe, largest measuring 5 mm.

Hepatobiliary: No hepatic masses identified. Gallbladder is
unremarkable. No evidence of biliary ductal dilatation. Is

Pancreas:  No mass or inflammatory changes.

Spleen: Within normal limits in size and appearance.

Adrenals/Urinary Tract: No masses identified. No evidence of
hydronephrosis. Unremarkable unopacified urinary bladder.

Stomach/Bowel: Normal appendix visualized. Small bowel including the
terminal ileum is unremarkable in appearance. Mild wall thickening
of ascending colon is seen with mild pericolonic stranding,
consistent with mild right-sided colitis. No evidence of abscess or
enteric fistula.

Vascular/Lymphatic: Mildly enlarged mesenteric lymph nodes in the
right lower quadrant cul largest measuring 12 mm. These are likely
reactive in etiology. No adenopathy seen elsewhere within the
abdomen or pelvis. Aortic atherosclerosis.

Reproductive: No masses identified. Tiny amount of free fluid in
pelvic cul-de-sac.

Other:  None.

Musculoskeletal:  No suspicious bone lesions identified.
IMPRESSION: 1. Findings suspicious for mild right-sided colitis. No evidence of
small bowel involvement, abscess, or fistula.
2. Mildly enlarged right lower quadrant mesenteric lymph nodes,
likely reactive in etiology.
3. Two adjacent indeterminate right lower lobe pulmonary nodules,
largest measuring 5 mm. No follow-up needed if patient is low-risk
(and has no known or suspected primary neoplasm). Non-contrast chest
CT can be considered in 12 months if patient is high-risk. This
recommendation follows the consensus statement: Guidelines for
Management of Incidental Pulmonary Nodules Detected on CT Images:
